# Patient Record
Sex: Male | Born: 1965 | Race: White | Hispanic: No | Smoking: Never smoker
Health system: Southern US, Community
[De-identification: ages and names within clinical notes are randomized; demographics above are authoritative.]

## PROBLEM LIST (undated history)

## (undated) DIAGNOSIS — R0600 Dyspnea, unspecified: Secondary | ICD-10-CM

## (undated) HISTORY — PX: PATELLAR TENDON REPAIR: SHX737

## (undated) HISTORY — DX: Dyspnea, unspecified: R06.00

## (undated) HISTORY — PX: LASIK: SHX215

---

## 2001-11-09 ENCOUNTER — Emergency Department (HOSPITAL_COMMUNITY): Admission: AC | Admit: 2001-11-09 | Discharge: 2001-11-09 | Payer: Self-pay

## 2001-11-09 ENCOUNTER — Encounter: Payer: Self-pay | Admitting: Emergency Medicine

## 2005-06-24 ENCOUNTER — Emergency Department: Payer: Self-pay | Admitting: Unknown Physician Specialty

## 2013-01-30 HISTORY — PX: CARDIAC CATHETERIZATION: SHX172

## 2013-08-05 ENCOUNTER — Ambulatory Visit (INDEPENDENT_AMBULATORY_CARE_PROVIDER_SITE_OTHER): Payer: BC Managed Care – PPO | Admitting: Pulmonary Disease

## 2013-08-05 ENCOUNTER — Encounter: Payer: Self-pay | Admitting: Pulmonary Disease

## 2013-08-05 ENCOUNTER — Ambulatory Visit (INDEPENDENT_AMBULATORY_CARE_PROVIDER_SITE_OTHER)
Admission: RE | Admit: 2013-08-05 | Discharge: 2013-08-05 | Disposition: A | Discharge status: Home / Self Care | Payer: BC Managed Care – PPO | Source: Ambulatory Visit | Attending: Pulmonary Disease | Admitting: Pulmonary Disease

## 2013-08-05 ENCOUNTER — Other Ambulatory Visit (INDEPENDENT_AMBULATORY_CARE_PROVIDER_SITE_OTHER): Payer: BC Managed Care – PPO

## 2013-08-05 ENCOUNTER — Encounter (INDEPENDENT_AMBULATORY_CARE_PROVIDER_SITE_OTHER): Payer: Self-pay

## 2013-08-05 VITALS — BP 150/94 | HR 76 | Ht 73.0 in | Wt 216.0 lb

## 2013-08-05 DIAGNOSIS — R0609 Other forms of dyspnea: Secondary | ICD-10-CM

## 2013-08-05 DIAGNOSIS — R0602 Shortness of breath: Secondary | ICD-10-CM

## 2013-08-05 DIAGNOSIS — R0989 Other specified symptoms and signs involving the circulatory and respiratory systems: Secondary | ICD-10-CM

## 2013-08-05 DIAGNOSIS — R9431 Abnormal electrocardiogram [ECG] [EKG]: Secondary | ICD-10-CM

## 2013-08-05 DIAGNOSIS — R0600 Dyspnea, unspecified: Secondary | ICD-10-CM

## 2013-08-05 LAB — BASIC METABOLIC PANEL
BUN: 19 mg/dL (ref 6–23)
CO2: 28 mEq/L (ref 19–32)
Calcium: 9.7 mg/dL (ref 8.4–10.5)
Chloride: 100 mEq/L (ref 96–112)
Creatinine, Ser: 1 mg/dL (ref 0.4–1.5)
GFR: 82.34 mL/min (ref >60.00)
Glucose, Bld: 101 mg/dL — ABNORMAL HIGH (ref 70–99)
Potassium: 4.1 mEq/L (ref 3.5–5.1)
Sodium: 135 mEq/L (ref 135–145)

## 2013-08-05 LAB — CBC WITH DIFFERENTIAL/PLATELET
Basophils Absolute: 0 10*3/uL (ref 0.0–0.1)
Basophils Relative: 0.6 % (ref 0.0–3.0)
Eosinophils Absolute: 0.1 10*3/uL (ref 0.0–0.7)
Eosinophils Relative: 0.9 % (ref 0.0–5.0)
HCT: 44.5 % (ref 39.0–52.0)
Hemoglobin: 14.8 g/dL (ref 13.0–17.0)
Lymphocytes Relative: 41.9 % (ref 12.0–46.0)
Lymphs Abs: 3.2 10*3/uL (ref 0.7–4.0)
MCHC: 33.3 g/dL (ref 30.0–36.0)
MCV: 88.3 fl (ref 78.0–100.0)
Monocytes Absolute: 0.7 10*3/uL (ref 0.1–1.0)
Monocytes Relative: 9.4 % (ref 3.0–12.0)
Neutro Abs: 3.6 10*3/uL (ref 1.4–7.7)
Neutrophils Relative %: 47.2 % (ref 43.0–77.0)
Platelets: 234 10*3/uL (ref 150.0–400.0)
RBC: 5.04 Mil/uL (ref 4.22–5.81)
RDW: 13.6 % (ref 11.5–15.5)
WBC: 7.5 10*3/uL (ref 4.0–10.5)

## 2013-08-05 NOTE — Progress Notes (Signed)
Subjective:    Patient ID: David Rush, male    DOB: 06/25/1965, 48 y.o.   MRN: [id number]  HPI  Past Medical History  Diagnosis Date  . Dyspnea     Mr. [name] has come to see me today because he has been noticing shortness of breath in the evenings when he is sitting at rest.  He says that the problem has been going on for over 6-8 months now and has been relatively stable.  Specifically, he feels that he often is not taking a deep enough breath so he frequently yawns or feels the need to sigh in order to fill his lungs with air.  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  He often has to carry trash, cut grass, or perform various maintenance type jobs and he never has dyspnea with that.  Further, he has not slowed down in the last year and he continues to play basketball without having to stop for shortness of breath. This problem only occurs at rest and does not occur exclusively when he his recumbent.  In fact he tells me that he feels dyspnic somewhat right now.  He has not been in an accident in the last year and he denies chest pain or leg swelling. No muscle weakness.  A ten point review of symptoms was otherwise negative.    No family history on file.   History   Social History  . Marital Status: Married    Spouse Name: N/A    Number of Children: N/A  . Years of Education: N/A   Occupational History  . real Engineer, drillingestate investor    Social History Main Topics  . Smoking status: Never Smoker   . Smokeless tobacco: Never Used  . Alcohol Use: Yes     Comment: 3 beers/week  . Drug Use: No  . Sexual Activity: Not on file   Other Topics Concern  . Not on file   Social History Narrative  . No narrative on file     Not on File   No outpatient prescriptions prior to visit.   No facility-administered medications prior to visit.      Review of Systems  Constitutional: Negative  for fever and unexpected weight change.  HENT: Negative for congestion, dental problem, ear pain, nosebleeds, postnasal drip, rhinorrhea, sinus pressure, sneezing, sore throat and trouble swallowing.   Eyes: Negative for redness and itching.  Respiratory: Positive for cough and shortness of breath. Negative for chest tightness and wheezing.   Cardiovascular: Negative for palpitations and leg swelling.  Gastrointestinal: Negative for nausea and vomiting.  Genitourinary: Negative for dysuria.  Musculoskeletal: Negative for joint swelling.  Skin: Negative for rash.  Neurological: Negative for headaches.  Hematological: Does not bruise/bleed easily.  Psychiatric/Behavioral: Negative for dysphoric mood. The patient is not nervous/anxious.        Objective:   Physical Exam Filed Vitals:   08/05/13 1548  BP: 150/94  Pulse: 76  Height: 6\' 1"  (1.854 m)  Weight: 216 lb (97.977 kg)  SpO2: 97%  RA  Ambulated 500 feet on RA and O2 saturation remained at 100%  Gen: well appearing, no acute distress HEENT: NCAT, PERRL, EOMi, OP clear, neck supple without masses PULM: CTA B CV: RRR, no mgr, no JVD AB: BS+, soft, nontender, no hsm Ext: warm, no edema, no clubbing, no cyanosis Derm: no rash or skin breakdown Neuro: A&Ox4, CN II-XII intact,  strength 5/5 in all 4 extremities   EKG: NSR, PR depression, diffuse J point elevation     Assessment & Plan:   Dyspnea Tayo's non-exertional dyspnea is hard to explain from a physiology standpoint because his exam is normal, his oximetry is normal and he is not obese.  He needs a chest x-ray, echo, cbc, basic metabolic panel, stress test, and echo to investigate further.  If all these tests are normal then he should exercise more regularly to see if that helps as deconditioning can cause dyspnea.  He does not exercise regularly.  Nonspecific abnormal electrocardiogram (ECG) (EKG) His EKG showed PR depression and diffuse ST elevation or J point  elevation which makes me consider a myocarditis or a pericardial process.  However, it is hard to imagine how these would produce resting dyspnea alone without exertional limitations.    Plan: -stress test an echo    Updated Medication List No outpatient encounter prescriptions on file as of 08/05/2013.

## 2013-08-05 NOTE — Assessment & Plan Note (Addendum)
Kayla's non-exertional dyspnea is hard to explain from a physiology standpoint because his exam is normal, his oximetry is normal and he is not obese.  He needs a chest x-ray, echo, cbc, basic metabolic panel, stress test, and echo to investigate further.  If all these tests are normal then he should exercise more regularly to see if that helps as deconditioning can cause dyspnea.  He does not exercise regularly.

## 2013-08-05 NOTE — Patient Instructions (Addendum)
[facility] will [facility] We will also arrange an echocardiogram and a stress test because your EKG was abnormal We will call you with the results of today's chest x-ray and blood work We will see you back in 3-4 weeks or sooner if needed

## 2013-08-05 NOTE — Assessment & Plan Note (Signed)
His EKG showed PR depression and diffuse ST elevation or J point elevation which makes me consider a myocarditis or a pericardial process.  However, it is hard to imagine how these would produce resting dyspnea alone without exertional limitations.    Plan: -stress test an echo

## 2013-08-19 ENCOUNTER — Telehealth: Payer: Self-pay | Admitting: Pulmonary Disease

## 2013-08-19 NOTE — Telephone Encounter (Signed)
Will forward to BQ as an FYI--- 

## 2013-08-20 ENCOUNTER — Encounter (HOSPITAL_COMMUNITY): Payer: BC Managed Care – PPO

## 2013-08-21 ENCOUNTER — Encounter (HOSPITAL_COMMUNITY): Payer: BC Managed Care – PPO

## 2013-08-21 ENCOUNTER — Ambulatory Visit (HOSPITAL_COMMUNITY): Payer: BC Managed Care – PPO

## 2013-08-26 ENCOUNTER — Telehealth: Payer: Self-pay | Admitting: Pulmonary Disease

## 2013-08-26 ENCOUNTER — Ambulatory Visit: Payer: BC Managed Care – PPO | Admitting: Pulmonary Disease

## 2013-08-26 NOTE — Telephone Encounter (Signed)
2d echo 08/27/13@4pm @[facility]  [address]-myo cardio 09/10/13@9 :30am @[facility], pfts and ov with dr Kendrick Friesmcquaid 09/17/13@1 &2pm David Rush

## 2013-08-26 NOTE — Telephone Encounter (Signed)
Pt has orders in EPIC for echo, PFT and myocardial perfusion. Pt had to cancel previous appts. Once these are scheduled then we can schedule pt to see BQ. Please advise PCC's in scheduling echo and myocardial perfusion. thanks

## 2013-08-27 ENCOUNTER — Ambulatory Visit (HOSPITAL_COMMUNITY): Payer: BC Managed Care – PPO | Attending: Cardiology

## 2013-08-28 ENCOUNTER — Encounter (HOSPITAL_COMMUNITY): Payer: Self-pay | Admitting: Pulmonary Disease

## 2013-09-02 ENCOUNTER — Ambulatory Visit (HOSPITAL_COMMUNITY): Payer: BC Managed Care – PPO

## 2013-09-05 ENCOUNTER — Telehealth (HOSPITAL_COMMUNITY): Payer: Self-pay

## 2013-09-05 NOTE — Telephone Encounter (Signed)
Encounter complete. 

## 2013-09-09 ENCOUNTER — Telehealth (HOSPITAL_COMMUNITY): Payer: Self-pay

## 2013-09-09 NOTE — Telephone Encounter (Signed)
Encounter complete. 

## 2013-09-10 ENCOUNTER — Ambulatory Visit (HOSPITAL_COMMUNITY)
Admission: RE | Admit: 2013-09-10 | Discharge: 2013-09-10 | Disposition: A | Discharge status: Home / Self Care | Payer: BC Managed Care – PPO | Source: Ambulatory Visit | Attending: Cardiovascular Disease | Admitting: Cardiovascular Disease

## 2013-09-10 DIAGNOSIS — R0602 Shortness of breath: Secondary | ICD-10-CM

## 2013-09-10 MED ORDER — TECHNETIUM TC 99M SESTAMIBI GENERIC - CARDIOLITE
30.0000 | Freq: Once | INTRAVENOUS | Status: AC | PRN
  Administered 2013-09-10: 30 via INTRAVENOUS

## 2013-09-10 MED ORDER — TECHNETIUM TC 99M SESTAMIBI GENERIC - CARDIOLITE
10.0000 | Freq: Once | INTRAVENOUS | Status: AC | PRN
  Administered 2013-09-10: 10 via INTRAVENOUS

## 2013-09-10 NOTE — Procedures (Addendum)
[name] [address] [place] [address] [phone]  Cardiology Nuclear Med Study  Marlane HatcherGeorge Travis Rush is a 48 y.o. male     MRN : [id number]     DOB: 11/14/1965  Procedure Date: 09/10/2013  Nuclear Med Background Indication for Stress Test:  Evaluation for Ischemia and Abnormal EKG History:  No prior cardiac or respiratory history reported;No prior NUC MPI for comparison. Cardiac Risk Factors: No risk factors reported;  Symptoms:  DOE, Palpitations and SOB   Nuclear Pre-Procedure Caffeine/Decaff Intake:  9:00pm NPO After: 7:00am   IV Site: R Forearm  IV 0.9% NS with Angio Cath:  22g  Chest Size (in):  48"  IV Started by: Berdie OgrenAmanda Wease, RN  Height: 6\' 1"  (1.854 m)  Cup Size: n/a  BMI:  Body mass index is 28.5 kg/(m^2). Weight:  216 lb (97.977 kg)   Tech Comments:  n/a    Nuclear Med Study 1 or 2 day study: 1 day  Stress Test Type:  Stress  Order Authorizing Provider:  Moss Mcennis McQuaid, MD   Resting Radionuclide: Technetium 5437m Sestamibi  Resting Radionuclide Dose: 10.7 mCi   Stress Radionuclide:  Technetium 6037m Sestamibi  Stress Radionuclide Dose: 30.2 mCi           Stress Protocol Rest HR:62 Stress HR: 176  Rest BP: 106/84 Stress BP: 136/94  Exercise Time (min): 10 METS: 12.3   Predicted Max HR: 171 bpm % Max HR: 102.92 bpm Rate Pressure Product: [id number]  Dose of Adenosine (mg):  n/a Dose of Lexiscan: n/a mg  Dose of Atropine (mg): n/a Dose of Dobutamine: n/a mcg/kg/min (at max HR)  Stress Test Technologist: Esperanza Sheetserry-Marie Martin, CCT Nuclear Technologist: Koren Shiverobin Moffitt, CNMT   Rest Procedure:  Myocardial perfusion imaging was performed at rest 45 minutes following the intravenous administration of Technetium 3037m Sestamibi. Stress Procedure:  The patient performed treadmill exercise using a Bruce  Protocol for 10 minutes. The patient stopped due to ST depression and mild Fatigue and denied any chest pain.   There were no significant ST-T wave changes.  Technetium 9137m Sestamibi was injected IV at peak exercise and myocardial perfusion imaging was performed after a brief delay.  Transient Ischemic Dilatation (Normal <1.22):  0.96 QGS EDV:  98 ml QGS ESV:  36 ml LV Ejection Fraction: 64%     Rest ECG: NSR, early repolarization pattern  Stress ECG: Significant ST abnormalities consistent with ischemia. 1-2 mm ST depression in the inferior leads, 1 mm ST depression in leads V5-V6  QPS Raw Data Images:  Normal; no motion artifact; normal heart/lung ratio. Stress Images:  There is decreased uptake in the anterior wall. The apex and septum are spared  Rest Images:  There is improvement in the anterior wall abnormality Subtraction (SDS):  These findings are consistent with ischemia. LV Wall Motion:  NL LV Function; NL Wall Motion  Impression Exercise Capacity:  Good exercise capacity. BP Response:  Normal blood pressure response. Clinical Symptoms:  No significant symptoms noted. ECG Impression:  Significant ST abnormalities consistent with ischemia. Comparison with Prior Nuclear Study: No previous nuclear study performed   Overall Impression:  Intermediate risk stress nuclear study with a moderate size anterior wall reversible defect. The territory does not match typical LAD artery distribution, but may represent diagonal artery obstruction.[name].   [name], MD  09/10/2013 1:05 PM

## 2013-09-11 ENCOUNTER — Telehealth: Payer: Self-pay | Admitting: Pulmonary Disease

## 2013-09-11 DIAGNOSIS — R0600 Dyspnea, unspecified: Secondary | ICD-10-CM

## 2013-09-11 NOTE — Telephone Encounter (Signed)
I called Mr. David Rush tonight to discuss the results of his stress test which showed an area of reversible ischemia.  Advised to minimize exercise for now, take a baby aspirin daily, go to ED if chest pain.  Will consult cardiology.  Triage, please arrange first available visit with cardiology  Thanks

## 2013-09-12 NOTE — Telephone Encounter (Signed)
Order already placed. Please advise PCC's thanks

## 2013-09-16 ENCOUNTER — Telehealth: Payer: Self-pay | Admitting: Pulmonary Disease

## 2013-09-16 NOTE — Telephone Encounter (Signed)
Have him hold off until after his cardiology visit Confirm he has an appointment with cardiology

## 2013-09-16 NOTE — Telephone Encounter (Signed)
BQ please advise if pt should keep the appt for tomorrow for PFT and appt with you.  Pt had the stress test done and this showed a blockage.  BQ please advise. Thanks  No Known Allergies  No current outpatient prescriptions on file prior to visit.   No current facility-administered medications on file prior to visit.

## 2013-09-16 NOTE — Telephone Encounter (Signed)
Spouse has cancelled both appts. Nothing further needed

## 2013-09-17 ENCOUNTER — Ambulatory Visit: Payer: BC Managed Care – PPO | Admitting: Pulmonary Disease

## 2013-09-22 ENCOUNTER — Encounter: Payer: Self-pay | Admitting: Cardiovascular Disease

## 2013-09-22 ENCOUNTER — Ambulatory Visit (INDEPENDENT_AMBULATORY_CARE_PROVIDER_SITE_OTHER): Payer: BC Managed Care – PPO | Admitting: Cardiovascular Disease

## 2013-09-22 VITALS — BP 110/70 | HR 58 | Ht 73.0 in | Wt 212.0 lb

## 2013-09-22 DIAGNOSIS — R0989 Other specified symptoms and signs involving the circulatory and respiratory systems: Secondary | ICD-10-CM

## 2013-09-22 DIAGNOSIS — R9439 Abnormal result of other cardiovascular function study: Secondary | ICD-10-CM | POA: Insufficient documentation

## 2013-09-22 DIAGNOSIS — R0600 Dyspnea, unspecified: Secondary | ICD-10-CM

## 2013-09-22 DIAGNOSIS — R0602 Shortness of breath: Secondary | ICD-10-CM

## 2013-09-22 DIAGNOSIS — R0609 Other forms of dyspnea: Secondary | ICD-10-CM

## 2013-09-22 NOTE — Progress Notes (Signed)
   Patient ID: David Rush, male    DOB: [birth year], 48 y.o.   MRN: [id number]  HPI Comments: Mr. Purohit is a 48 year old gentleman with shortness of breath over the past year, recently seen by Dr. Kendrick Fries of pulmonary, recent stress test who presents to establish care in the [place] office .  He reports that symptoms of shortness of breath have been coming on over the past year. Typically symptoms occur at rest in the evenings. He does not have any significant symptoms with exertion. Wife has often noted him breathing heavily for no reason .  he feels that he often is not taking a deep enough breath so he frequently yawns or feels the need to sigh in order to fill his lungs with air.  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  he plays basketball without having to stop for shortness of breath. This problem only occurs at rest and does not occur exclusively when he his recumbent.  Recent stress test showing anterior wall ischemia, normal ejection fraction  EKG shows normal sinus rhythm with rate 58 beats per minute, no significant ST or T wave changes   No outpatient encounter prescriptions on file as of 09/22/2013.    Review of Systems  Constitutional: Negative.   HENT: Negative.   Eyes: Negative.   Respiratory: Positive for shortness of breath.   Cardiovascular: Negative.   Gastrointestinal: Negative.   Endocrine: Negative.   Musculoskeletal: Negative.   Skin: Negative.   Allergic/Immunologic: Negative.   Neurological: Negative.   Hematological: Negative.   Psychiatric/Behavioral: Negative.   All other systems reviewed and are negative.   BP 110/70  Pulse 58  Ht  (1.854 m)  Wt 212 lb (96.163 kg)  BMI 27.98 kg/m2  Physical Exam  Nursing note and vitals reviewed. Constitutional: He is oriented to person, place, and time. He appears well-developed and well-nourished.  HENT:  Head: Normocephalic.  Nose: Nose  normal.  Mouth/Throat: Oropharynx is clear and moist.  Eyes: Conjunctivae are normal. Pupils are equal, round, and reactive to light.  Neck: Normal range of motion. Neck supple. No JVD present.  Cardiovascular: Normal rate, regular rhythm, S1 normal, S2 normal, normal heart sounds and intact distal pulses.  Exam reveals no gallop and no friction rub.   No murmur heard. Pulmonary/Chest: Effort normal and breath sounds normal. No respiratory distress. He has no wheezes. He has no rales. He exhibits no tenderness.  Abdominal: Soft. Bowel sounds are normal. He exhibits no distension. There is no tenderness.  Musculoskeletal: Normal range of motion. He exhibits no edema and no tenderness.  Lymphadenopathy:    He has no cervical adenopathy.  Neurological: He is alert and oriented to person, place, and time. Coordination normal.  Skin: Skin is warm and dry. No rash noted. No erythema.  Psychiatric: He has a normal mood and affect. His behavior is normal. Judgment and thought content normal.      Assessment and Plan

## 2013-09-22 NOTE — Patient Instructions (Addendum)
We will schedule you for a cardiac cath this Thursday at [facility] will be drawn today No medication changes were made.  Please call us if you have new issues that need to be addressed before your next appt.    [facility] Cardiac Cath Instructions   You are scheduled for a Cardiac Cath on:___Thursday, August 27______  Please arrive at 7:30am on the day of your procedure  You will need to pre-register prior to the day of your procedure.  Enter through the [facility] at [facility].  Registration is the first desk on your right.  Please take the procedure order we have given you in order to be registered appropriately  Do not eat/drink anything after midnight  Someone will need to drive you home  It is recommended someone be with you for the first 24 hours after your procedure  Wear clothes that are easy to get on/off and wear slip on shoes if possible  Day of your procedure: [facility] at [facility] entrance.  Free valet service is available.  After entering the [facility] please check-in at the registration desk (1st desk on your right) to receive your armband. After receiving your armband someone will escort you to the cardiac cath/special procedures waiting area.  The usual length of stay after your procedure is about 2 to 3 hours.  This can vary.  If you have any questions, please call our office at [phone], or you may call the cardiac cath lab at [facility] directly at [phone]

## 2013-09-22 NOTE — Assessment & Plan Note (Addendum)
Etiology of his shortness of breath is unclear, but will proceed with cardiac catheterization given his positive stress test. If symptoms persist or get worse, echocardiogram could be performed though this would likely be normal and there are no clinical signs of heart failure.

## 2013-09-22 NOTE — Assessment & Plan Note (Signed)
Recent treadmill Myoview showing good exercise tolerance, with perfusion defect noted in the anterior wall. We discussed the various treatment options given the positive stress test but atypical presentation of his symptoms. He has very few risk factors for underlying ischemia. My suspicion is that this may be a false positive but he is concerned about his symptoms and the test result. Risk and benefits discussed with him including risk of complication from the procedure if we did proceed with cardiac catheterization. He would like to schedule the catheterization ASAP. His wife had significant questions concerning whether to do the procedure at [facility] or [facility]. All of their questions were answered and they will proceed with the procedure at [facility] on 09/25/2013.  Percentage of stenosis in each vessel if known:  Ejection fraction from stress test or echo within the past 6 months: Normal ejection fraction by stress test, greater than 55%  Result of any study/stress testing in the prior 6 months: Recent anterior wall ischemia on stress test done at [place]  Anginal status Including stable, unstable, non-STEMI, or anginal equivalent symptoms: Stable angina  Anginal CCS of 1 through 4: 2  ACS, yes or no: No ACS  NYHA classification if CHF history or in CHF: No CHF history  Oral airway a 2

## 2013-09-23 LAB — BASIC METABOLIC PANEL
BUN/Creatinine Ratio: 15 (ref 9–20)
BUN: 14 mg/dL (ref 6–24)
CO2: 20 mmol/L (ref 18–29)
CREATININE: 0.94 mg/dL (ref 0.76–1.27)
Calcium: 9.4 mg/dL (ref 8.7–10.2)
Chloride: 102 mmol/L (ref 97–108)
GFR calc non Af Amer: 95 mL/min/{1.73_m2} (ref >59)
GFR, EST AFRICAN AMERICAN: 110 mL/min/{1.73_m2} (ref >59)
GLUCOSE: 104 mg/dL — AB (ref 65–99)
Potassium: 3.9 mmol/L (ref 3.5–5.2)
Sodium: 139 mmol/L (ref 134–144)

## 2013-09-23 LAB — PROTIME-INR
INR: 1 (ref 0.8–1.2)
PROTHROMBIN TIME: 10.5 s (ref 9.1–12.0)

## 2013-09-23 LAB — CBC WITH DIFFERENTIAL
BASOS: 1 %
Basophils Absolute: 0 10*3/uL (ref 0.0–0.2)
EOS: 1 %
Eosinophils Absolute: 0.1 10*3/uL (ref 0.0–0.4)
HCT: 45.6 % (ref 37.5–51.0)
HEMOGLOBIN: 15.4 g/dL (ref 12.6–17.7)
IMMATURE GRANULOCYTES: 0 %
Immature Grans (Abs): 0 10*3/uL (ref 0.0–0.1)
Lymphocytes Absolute: 2.3 10*3/uL (ref 0.7–3.1)
Lymphs: 37 %
MCH: 29.3 pg (ref 26.6–33.0)
MCHC: 33.8 g/dL (ref 31.5–35.7)
MCV: 87 fL (ref 79–97)
MONOCYTES: 8 %
MONOS ABS: 0.5 10*3/uL (ref 0.1–0.9)
NEUTROS PCT: 53 %
Neutrophils Absolute: 3.3 10*3/uL (ref 1.4–7.0)
Platelets: 230 10*3/uL (ref 150–379)
RBC: 5.25 x10E6/uL (ref 4.14–5.80)
RDW: 14.1 % (ref 12.3–15.4)
WBC: 6.2 10*3/uL (ref 3.4–10.8)

## 2013-09-25 ENCOUNTER — Ambulatory Visit: Payer: Self-pay | Admitting: Cardiovascular Disease

## 2013-09-25 DIAGNOSIS — R0602 Shortness of breath: Secondary | ICD-10-CM

## 2013-10-08 ENCOUNTER — Encounter: Payer: Self-pay | Admitting: Cardiovascular Disease

## 2013-10-13 ENCOUNTER — Ambulatory Visit (INDEPENDENT_AMBULATORY_CARE_PROVIDER_SITE_OTHER): Payer: BC Managed Care – PPO | Admitting: Cardiovascular Disease

## 2013-10-13 ENCOUNTER — Encounter: Payer: Self-pay | Admitting: Cardiovascular Disease

## 2013-10-13 VITALS — BP 110/90 | HR 72 | Ht 73.0 in | Wt 210.5 lb

## 2013-10-13 DIAGNOSIS — R0600 Dyspnea, unspecified: Secondary | ICD-10-CM

## 2013-10-13 DIAGNOSIS — R9439 Abnormal result of other cardiovascular function study: Secondary | ICD-10-CM

## 2013-10-13 DIAGNOSIS — R0989 Other specified symptoms and signs involving the circulatory and respiratory systems: Secondary | ICD-10-CM

## 2013-10-13 DIAGNOSIS — R0609 Other forms of dyspnea: Secondary | ICD-10-CM

## 2013-10-13 NOTE — Assessment & Plan Note (Signed)
He reports his breathing is back to baseline. Occasional deep breaths at rest likely a normal phenomenon

## 2013-10-13 NOTE — Progress Notes (Signed)
   Patient ID: David Rush, male    DOB: July 05, 1965, 48 y.o.   MRN: [id number]  HPI Comments: Mr. Stainback is a 48 year old gentleman with shortness of breath over the past year, recently seen by Dr. Kendrick Fries of pulmonary, recent stress test with anterior wall perfusion defect presents for followup in the clinic after recent cardiac catheterization.  Cardiac catheterization showed no significant coronary artery disease, no valve disease, normal left ventricular end-diastolic pressure. Good wall function with normal ejection fraction. Overall a normal study.  He has been very active, no limitations. Has not noticed any significant shortness of breath with exertion. Overall feels he is at his baseline he plays basketball without having to stop for shortness of breath.   Recent stress test showing anterior wall ischemia, normal ejection fraction  Previous EKG showed normal sinus rhythm with rate 58 beats per minute, no significant ST or T wave changes   No outpatient encounter prescriptions on file as of 10/13/2013.    Review of Systems  Constitutional: Negative.   HENT: Negative.   Eyes: Negative.   Respiratory: Negative.   Cardiovascular: Negative.   Gastrointestinal: Negative.   Endocrine: Negative.   Musculoskeletal: Negative.   Skin: Negative.   Allergic/Immunologic: Negative.   Neurological: Negative.   Hematological: Negative.   Psychiatric/Behavioral: Negative.   All other systems reviewed and are negative.   BP 110/90  Pulse 72  Ht  (1.854 m)  Wt 210 lb 8 oz (95.482 kg)  BMI 27.78 kg/m2  Physical Exam  Nursing note and vitals reviewed. Constitutional: He is oriented to person, place, and time. He appears well-developed and well-nourished.  HENT:  Head: Normocephalic.  Nose: Nose normal.  Mouth/Throat: Oropharynx is clear and moist.  Eyes: Conjunctivae are normal. Pupils are equal, round, and reactive to light.  Neck: Normal range of motion. Neck supple. No  JVD present.  Cardiovascular: Normal rate, regular rhythm, S1 normal, S2 normal, normal heart sounds and intact distal pulses.  Exam reveals no gallop and no friction rub.   No murmur heard. Pulmonary/Chest: Effort normal and breath sounds normal. No respiratory distress. He has no wheezes. He has no rales. He exhibits no tenderness.  Abdominal: Soft. Bowel sounds are normal. He exhibits no distension. There is no tenderness.  Musculoskeletal: Normal range of motion. He exhibits no edema and no tenderness.  Lymphadenopathy:    He has no cervical adenopathy.  Neurological: He is alert and oriented to person, place, and time. Coordination normal.  Skin: Skin is warm and dry. No rash noted. No erythema.  Psychiatric: He has a normal mood and affect. His behavior is normal. Judgment and thought content normal.      Assessment and Plan

## 2013-10-13 NOTE — Patient Instructions (Signed)
You are doing well. No medication changes were made.  Please call us if you have new issues that need to be addressed before your next appt.  Your physician wants you to follow-up in: 12 months.  You will receive a reminder letter in the mail two months in advance. If you don't receive a letter, please call our office to schedule the follow-up appointment. 

## 2013-10-13 NOTE — Assessment & Plan Note (Signed)
False-positive stress test. Recent catheterization showing no significant disease.

## 2013-11-14 ENCOUNTER — Other Ambulatory Visit: Payer: Self-pay

## 2013-12-18 ENCOUNTER — Ambulatory Visit (INDEPENDENT_AMBULATORY_CARE_PROVIDER_SITE_OTHER): Payer: BC Managed Care – PPO | Admitting: Internal Medicine

## 2013-12-18 ENCOUNTER — Encounter: Payer: Self-pay | Admitting: Internal Medicine

## 2013-12-18 VITALS — BP 118/82 | HR 60 | Temp 97.9°F | Ht 73.25 in | Wt 213.0 lb

## 2013-12-18 DIAGNOSIS — M79601 Pain in right arm: Secondary | ICD-10-CM

## 2013-12-18 DIAGNOSIS — M79602 Pain in left arm: Secondary | ICD-10-CM

## 2013-12-18 NOTE — Progress Notes (Signed)
Pre visit review using our clinic review tool, if applicable. No additional management support is needed unless otherwise documented below in the visit note. 

## 2013-12-18 NOTE — Patient Instructions (Signed)
Elbow Exercises EXERCISES RANGE OF MOTION (ROM) AND STRETCHING EXERCISES  These exercises may help you when beginning to rehabilitate your injury. Your symptoms may go away with or without further involvement from your physician, physical therapist or athletic trainer. While completing these exercises, remember:   Restoring tissue flexibility helps normal motion to return to the joints. This allows healthier, less painful movement and activity.  An effective stretch should be held for at least 30 seconds.  A stretch should never be painful. You should only feel a gentle lengthening or release in the stretched tissue. RANGE OF MOTION - Extension  Hold your right / left arm at your side and straighten your elbow as far as you can, using your right / left arm muscles.  Straighten the elbow farther by gently pushing down on your forearm until you feel a gentle stretch on the inside of your elbow. Hold this position for __________ seconds.  Slowly return to the starting position. Repeat __________ times. Complete this exercise __________ times per day.  RANGE OF MOTION - Flexion  Hold your right / left arm at your side and bend your elbow as far as you can using your arm muscles.  Bend the right / left elbow farther by gently pushing up on your forearm until you feel a gentle stretch on the outside of your elbow. Hold this position for __________ seconds.  Slowly return to the starting position. Repeat __________ times. Complete this exercise __________ times per day.  RANGE OF MOTION - Supination, Active  Stand or sit with your elbows at your side. Bend your right / left elbow to 90 degrees.  Turn your palm upward until you feel a gentle stretch on the inside of your forearm.  Hold this position for __________ seconds. Slowly release and return to the starting position. Repeat __________ times. Complete this stretch __________ times per day.  RANGE OF MOTION - Pronation, Active  Stand  or sit with your elbows at your side. Bend your right / left elbow to 90 degrees.  Turn your palm downward until you feel a gentle stretch on the top of your forearm.  Hold this position for __________ seconds. Slowly release and return to the starting position. Repeat __________ times. Complete this stretch __________ times per day.  STRETCH - Elbow Flexors  Lie on a firm bed or countertop, on your back. Be sure that you are in a comfortable position which will allow you to relax your arm muscles.  Place a folded towel under your right / left upper arm, so that your elbow and shoulder are at the same height. Extend your arm; your elbow should not rest on the bed or towel  Allow the weight of your hand to straighten your elbow. Keep your arm and chest muscles relaxed. Your caregiver may ask you to increase the intensity of your stretch by adding a small wrist or hand weight.  Hold for __________ seconds. You should feel a stretch on the inside of your elbow. Slowly return to the starting position. Repeat __________ times. Complete this exercise __________ times per day. STRENGTHENING EXERCISES  These exercises may help you when beginning to rehabilitate your injury. They may resolve your symptoms with or without further involvement from your physician, physical therapist or athletic trainer. While completing these exercises, remember:   Muscles can gain both the endurance and the strength needed for everyday activities through controlled exercises.  Complete these exercises as instructed by your physician, physical therapist or athletic  trainer. Increase the resistance and repetitions only as guided.  You may experience muscle soreness or fatigue, but the pain or discomfort you are trying to eliminate should never worsen during these exercises. If this pain does get worse, stop and make sure you are following the directions exactly. If the pain is still present after adjustments, discontinue  the exercise until you can discuss the trouble with your caregiver. STRENGTH - Elbow Flexors, Isometric   Stand or sit upright on a firm surface. Place your right / left arm so that your hand is palm-up and at the height of your waist.  Place your opposite hand on top of your forearm. Gently push down as your right / left arm resists. Push as hard as you can with both arms without causing any pain or movement at your right / left elbow. Hold this stationary position for __________ seconds.  Gradually release the tension in both arms. Allow your muscles to relax completely before repeating. Repeat __________ times. Complete this exercise __________ times per day. STRENGTH - Elbow Extensors, Isometric  Stand or sit upright on a firm surface. Place your right / left arm so that your palm faces your abdomen and it is at the height of your waist.  Place your opposite hand on the underside of your forearm. Gently push up as your right / left arm resists. Push as hard as you can with both arms without causing any pain or movement at your right / left elbow. Hold this stationary position for __________ seconds.  Gradually release the tension in both arms. Allow your muscles to relax completely before repeating. Repeat __________ times. Complete this exercise __________ times per day. STRENGTH - Elbow Flexors, Supinated  With good posture, stand, or sit on a firm chair without armrests. Allow your right / left arm to rest at your side with your palm facing forward.  Holding a __________ weight, or gripping a rubber exercise band or tubing, bring your hand toward your shoulder.  Allow your muscles to control the resistance, as your hand returns to your side. Repeat __________ times. Complete this exercise __________ times per day.  STRENGTH - Elbow Extensors, Dynamic  With good posture, stand, or sit on a firm chair without armrests. Keeping your upper arms at your side, bring both hands up toward  your right / left shoulder while gripping a rubber exercise band or tubing. Your right / left hand should be just below the other hand.  Straighten your right / left elbow. Hold for __________ seconds.  Allow your muscles to control the rubber exercise band, as your hand returns to your shoulder. Repeat __________ times. Complete this exercise __________ times per day. STRENGTH - Forearm Supinators   Sit with your right / left forearm supported on a table, keeping your elbow below shoulder height. Rest your hand over the edge, palm down.  Gently grip a hammer or a soup ladle.  Without moving your elbow, slowly turn your palm and hand upward to a "thumbs-up" position.  Hold this position for __________ seconds. Slowly return to the starting position. Repeat __________ times. Complete this exercise __________ times per day.  STRENGTH - Forearm Pronators  Sit with your right / left forearm supported on a table, keeping your elbow below shoulder height. Rest your hand over the edge, palm up.  Gently grip a hammer or a soup ladle.  Without moving your elbow, slowly turn your palm and hand upward to a "thumbs-up" position.  Hold this  position for __________ seconds. Slowly return to the starting position. Repeat __________ times. Complete this exercise __________ times per day. Document Released: 11/30/2004 Document Revised: 04/10/2011 Document Reviewed: 04/30/2008 [facility] Patient Information 2015 [place], [place]. This information is not intended to replace advice given to you by your health care provider. Make sure you discuss any questions you have with your health care provider. Elbow Exercises EXERCISES RANGE OF MOTION (ROM) AND STRETCHING EXERCISES  These exercises may help you when beginning to rehabilitate your injury. Your symptoms may go away with or without further involvement from your physician, physical therapist or athletic trainer. While completing these exercises, remember:     Restoring tissue flexibility helps normal motion to return to the joints. This allows healthier, less painful movement and activity.  An effective stretch should be held for at least 30 seconds.  A stretch should never be painful. You should only feel a gentle lengthening or release in the stretched tissue. RANGE OF MOTION - Extension  Hold your right / left arm at your side and straighten your elbow as far as you can, using your right / left arm muscles.  Straighten the elbow farther by gently pushing down on your forearm until you feel a gentle stretch on the inside of your elbow. Hold this position for __________ seconds.  Slowly return to the starting position. Repeat __________ times. Complete this exercise __________ times per day.  RANGE OF MOTION - Flexion  Hold your right / left arm at your side and bend your elbow as far as you can using your arm muscles.  Bend the right / left elbow farther by gently pushing up on your forearm until you feel a gentle stretch on the outside of your elbow. Hold this position for __________ seconds.  Slowly return to the starting position. Repeat __________ times. Complete this exercise __________ times per day.  RANGE OF MOTION - Supination, Active  Stand or sit with your elbows at your side. Bend your right / left elbow to 90 degrees.  Turn your palm upward until you feel a gentle stretch on the inside of your forearm.  Hold this position for __________ seconds. Slowly release and return to the starting position. Repeat __________ times. Complete this stretch __________ times per day.  RANGE OF MOTION - Pronation, Active  Stand or sit with your elbows at your side. Bend your right / left elbow to 90 degrees.  Turn your palm downward until you feel a gentle stretch on the top of your forearm.  Hold this position for __________ seconds. Slowly release and return to the starting position. Repeat __________ times. Complete this stretch  __________ times per day.  STRETCH - Elbow Flexors  Lie on a firm bed or countertop, on your back. Be sure that you are in a comfortable position which will allow you to relax your arm muscles.  Place a folded towel under your right / left upper arm, so that your elbow and shoulder are at the same height. Extend your arm; your elbow should not rest on the bed or towel  Allow the weight of your hand to straighten your elbow. Keep your arm and chest muscles relaxed. Your caregiver may ask you to increase the intensity of your stretch by adding a small wrist or hand weight.  Hold for __________ seconds. You should feel a stretch on the inside of your elbow. Slowly return to the starting position. Repeat __________ times. Complete this exercise __________ times per day. STRENGTHENING EXERCISES  These exercises may help you when beginning to rehabilitate your injury. They may resolve your symptoms with or without further involvement from your physician, physical therapist or athletic trainer. While completing these exercises, remember:   Muscles can gain both the endurance and the strength needed for everyday activities through controlled exercises.  Complete these exercises as instructed by your physician, physical therapist or athletic trainer. Increase the resistance and repetitions only as guided.  You may experience muscle soreness or fatigue, but the pain or discomfort you are trying to eliminate should never worsen during these exercises. If this pain does get worse, stop and make sure you are following the directions exactly. If the pain is still present after adjustments, discontinue the exercise until you can discuss the trouble with your caregiver. STRENGTH - Elbow Flexors, Isometric   Stand or sit upright on a firm surface. Place your right / left arm so that your hand is palm-up and at the height of your waist.  Place your opposite hand on top of your forearm. Gently push down as your  right / left arm resists. Push as hard as you can with both arms without causing any pain or movement at your right / left elbow. Hold this stationary position for __________ seconds.  Gradually release the tension in both arms. Allow your muscles to relax completely before repeating. Repeat __________ times. Complete this exercise __________ times per day. STRENGTH - Elbow Extensors, Isometric  Stand or sit upright on a firm surface. Place your right / left arm so that your palm faces your abdomen and it is at the height of your waist.  Place your opposite hand on the underside of your forearm. Gently push up as your right / left arm resists. Push as hard as you can with both arms without causing any pain or movement at your right / left elbow. Hold this stationary position for __________ seconds.  Gradually release the tension in both arms. Allow your muscles to relax completely before repeating. Repeat __________ times. Complete this exercise __________ times per day. STRENGTH - Elbow Flexors, Supinated  With good posture, stand, or sit on a firm chair without armrests. Allow your right / left arm to rest at your side with your palm facing forward.  Holding a __________ weight, or gripping a rubber exercise band or tubing, bring your hand toward your shoulder.  Allow your muscles to control the resistance, as your hand returns to your side. Repeat __________ times. Complete this exercise __________ times per day.  STRENGTH - Elbow Extensors, Dynamic  With good posture, stand, or sit on a firm chair without armrests. Keeping your upper arms at your side, bring both hands up toward your right / left shoulder while gripping a rubber exercise band or tubing. Your right / left hand should be just below the other hand.  Straighten your right / left elbow. Hold for __________ seconds.  Allow your muscles to control the rubber exercise band, as your hand returns to your shoulder. Repeat  __________ times. Complete this exercise __________ times per day. STRENGTH - Forearm Supinators   Sit with your right / left forearm supported on a table, keeping your elbow below shoulder height. Rest your hand over the edge, palm down.  Gently grip a hammer or a soup ladle.  Without moving your elbow, slowly turn your palm and hand upward to a "thumbs-up" position.  Hold this position for __________ seconds. Slowly return to the starting position. Repeat __________ times. Complete  this exercise __________ times per day.  STRENGTH - Forearm Pronators  Sit with your right / left forearm supported on a table, keeping your elbow below shoulder height. Rest your hand over the edge, palm up.  Gently grip a hammer or a soup ladle.  Without moving your elbow, slowly turn your palm and hand upward to a "thumbs-up" position.  Hold this position for __________ seconds. Slowly return to the starting position. Repeat __________ times. Complete this exercise __________ times per day. Document Released: 11/30/2004 Document Revised: 04/10/2011 Document Reviewed: 04/30/2008 [facility] Patient Information 2015 [place], [place]. This information is not intended to replace advice given to you by your health care provider. Make sure you discuss any questions you have with your health care provider. Elbow Exercises EXERCISES RANGE OF MOTION (ROM) AND STRETCHING EXERCISES  These exercises may help you when beginning to rehabilitate your injury. Your symptoms may go away with or without further involvement from your physician, physical therapist or athletic trainer. While completing these exercises, remember:   Restoring tissue flexibility helps normal motion to return to the joints. This allows healthier, less painful movement and activity.  An effective stretch should be held for at least 30 seconds.  A stretch should never be painful. You should only feel a gentle lengthening or release in the stretched  tissue. RANGE OF MOTION - Extension  Hold your right / left arm at your side and straighten your elbow as far as you can, using your right / left arm muscles.  Straighten the elbow farther by gently pushing down on your forearm until you feel a gentle stretch on the inside of your elbow. Hold this position for __________ seconds.  Slowly return to the starting position. Repeat __________ times. Complete this exercise __________ times per day.  RANGE OF MOTION - Flexion  Hold your right / left arm at your side and bend your elbow as far as you can using your arm muscles.  Bend the right / left elbow farther by gently pushing up on your forearm until you feel a gentle stretch on the outside of your elbow. Hold this position for __________ seconds.  Slowly return to the starting position. Repeat __________ times. Complete this exercise __________ times per day.  RANGE OF MOTION - Supination, Active  Stand or sit with your elbows at your side. Bend your right / left elbow to 90 degrees.  Turn your palm upward until you feel a gentle stretch on the inside of your forearm.  Hold this position for __________ seconds. Slowly release and return to the starting position. Repeat __________ times. Complete this stretch __________ times per day.  RANGE OF MOTION - Pronation, Active  Stand or sit with your elbows at your side. Bend your right / left elbow to 90 degrees.  Turn your palm downward until you feel a gentle stretch on the top of your forearm.  Hold this position for __________ seconds. Slowly release and return to the starting position. Repeat __________ times. Complete this stretch __________ times per day.  STRETCH - Elbow Flexors  Lie on a firm bed or countertop, on your back. Be sure that you are in a comfortable position which will allow you to relax your arm muscles.  Place a folded towel under your right / left upper arm, so that your elbow and shoulder are at the same  height. Extend your arm; your elbow should not rest on the bed or towel  Allow the weight of your hand to  straighten your elbow. Keep your arm and chest muscles relaxed. Your caregiver may ask you to increase the intensity of your stretch by adding a small wrist or hand weight.  Hold for __________ seconds. You should feel a stretch on the inside of your elbow. Slowly return to the starting position. Repeat __________ times. Complete this exercise __________ times per day. STRENGTHENING EXERCISES  These exercises may help you when beginning to rehabilitate your injury. They may resolve your symptoms with or without further involvement from your physician, physical therapist or athletic trainer. While completing these exercises, remember:   Muscles can gain both the endurance and the strength needed for everyday activities through controlled exercises.  Complete these exercises as instructed by your physician, physical therapist or athletic trainer. Increase the resistance and repetitions only as guided.  You may experience muscle soreness or fatigue, but the pain or discomfort you are trying to eliminate should never worsen during these exercises. If this pain does get worse, stop and make sure you are following the directions exactly. If the pain is still present after adjustments, discontinue the exercise until you can discuss the trouble with your caregiver. STRENGTH - Elbow Flexors, Isometric   Stand or sit upright on a firm surface. Place your right / left arm so that your hand is palm-up and at the height of your waist.  Place your opposite hand on top of your forearm. Gently push down as your right / left arm resists. Push as hard as you can with both arms without causing any pain or movement at your right / left elbow. Hold this stationary position for __________ seconds.  Gradually release the tension in both arms. Allow your muscles to relax completely before repeating. Repeat  __________ times. Complete this exercise __________ times per day. STRENGTH - Elbow Extensors, Isometric  Stand or sit upright on a firm surface. Place your right / left arm so that your palm faces your abdomen and it is at the height of your waist.  Place your opposite hand on the underside of your forearm. Gently push up as your right / left arm resists. Push as hard as you can with both arms without causing any pain or movement at your right / left elbow. Hold this stationary position for __________ seconds.  Gradually release the tension in both arms. Allow your muscles to relax completely before repeating. Repeat __________ times. Complete this exercise __________ times per day. STRENGTH - Elbow Flexors, Supinated  With good posture, stand, or sit on a firm chair without armrests. Allow your right / left arm to rest at your side with your palm facing forward.  Holding a __________ weight, or gripping a rubber exercise band or tubing, bring your hand toward your shoulder.  Allow your muscles to control the resistance, as your hand returns to your side. Repeat __________ times. Complete this exercise __________ times per day.  STRENGTH - Elbow Extensors, Dynamic  With good posture, stand, or sit on a firm chair without armrests. Keeping your upper arms at your side, bring both hands up toward your right / left shoulder while gripping a rubber exercise band or tubing. Your right / left hand should be just below the other hand.  Straighten your right / left elbow. Hold for __________ seconds.  Allow your muscles to control the rubber exercise band, as your hand returns to your shoulder. Repeat __________ times. Complete this exercise __________ times per day. STRENGTH - Forearm Supinators   Sit with your  right / left forearm supported on a table, keeping your elbow below shoulder height. Rest your hand over the edge, palm down.  Gently grip a hammer or a soup ladle.  Without moving  your elbow, slowly turn your palm and hand upward to a "thumbs-up" position.  Hold this position for __________ seconds. Slowly return to the starting position. Repeat __________ times. Complete this exercise __________ times per day.  STRENGTH - Forearm Pronators  Sit with your right / left forearm supported on a table, keeping your elbow below shoulder height. Rest your hand over the edge, palm up.  Gently grip a hammer or a soup ladle.  Without moving your elbow, slowly turn your palm and hand upward to a "thumbs-up" position.  Hold this position for __________ seconds. Slowly return to the starting position. Repeat __________ times. Complete this exercise __________ times per day. Document Released: 11/30/2004 Document Revised: 04/10/2011 Document Reviewed: 04/30/2008 [facility] Patient Information 2015 [place]. This information is not intended to replace advice given to you by your health care provider. Make sure you discuss any questions you have with your health care provider.

## 2013-12-18 NOTE — Progress Notes (Signed)
HPI  Pt presents to the clinic today to establish care. He has not had a PCP in many years. He does have some concerns today about bilateral arm pain. He reports that he is unable to fully extend his arms at the elbows. He does have pain with extension. He denies any specific injury to the area. He has been evaluated by ortho, Dr. Katrinka BlazingSmith in [place]. Xrays were done and he was told he had arthritis and that he should avoid strenuous activity as this would make it worse. He denies numbness or tingling in the arms. He denies pain in the elbow joint. He has tried Mobic without much relief.  Flu: never Tetanus: > 10 years ago Vision Screening: as needed Dentist: biannually  Past Medical History  Diagnosis Date  . Dyspnea     Current Outpatient Prescriptions  Medication Sig Dispense Refill  . meloxicam (MOBIC) 15 MG tablet   0   No current facility-administered medications for this visit.    No Known Allergies  Family History  Problem Relation Age of Onset  . Diabetes Maternal Grandmother   . Stroke Maternal Grandmother   . Cancer Neg Hx   . Heart disease Neg Hx     History   Social History  . Marital Status: Divorced    Spouse Name: N/A    Number of Children: N/A  . Years of Education: N/A   Occupational History  . real Engineer, drillingestate investor    Social History Main Topics  . Smoking status: Never Smoker   . Smokeless tobacco: Never Used  . Alcohol Use: 0.0 oz/week    0 Not specified per week     Comment: 3 beers/week  . Drug Use: No  . Sexual Activity: Yes   Other Topics Concern  . Not on file   Social History Narrative    ROS:  Constitutional: Denies fever, malaise, fatigue, headache or abrupt weight changes.  Respiratory: Denies difficulty breathing, shortness of breath, cough or sputum production.   Cardiovascular: Denies chest pain, chest tightness, palpitations or swelling in the hands or feet.  Musculoskeletal: Pt reports bilateral arm pain. Denies  difficulty with gait, muscle pain or joint swelling.    No other specific complaints in a complete review of systems (except as listed in HPI above).  PE:  BP 118/82 mmHg  Pulse 60  Temp(Src) 97.9 F (36.6 C) (Oral)  Ht 6' 1.25" (1.861 m)  Wt 213 lb (96.616 kg)  BMI 27.90 kg/m2  SpO2 98% Wt Readings from Last 3 Encounters:  12/18/13 213 lb (96.616 kg)  10/13/13 210 lb 8 oz (95.482 kg)  09/22/13 212 lb (96.163 kg)    General: Appears his stated age, well developed, well nourished in NAD. Cardiovascular: Normal rate and rhythm. S1,S2 noted.  No murmur, rubs or gallops noted.  Pulmonary/Chest: Normal effort and positive vesicular breath sounds. No respiratory distress. No wheezes, rales or ronchi noted.  Musculoskeletal: Decreased extension of the elbow, R>L. Unable to fully extend the elbow with passive ROM. Normal flexion of the elbows. Shoulder/arm strength 5/5. Hand grips equal bilaterally.  BMET    Component Value Date/Time   NA 139 09/22/2013 0825   NA 135 08/05/2013 1651   K 3.9 09/22/2013 0825   CL 102 09/22/2013 0825   CO2 20 09/22/2013 0825   GLUCOSE 104* 09/22/2013 0825   GLUCOSE 101* 08/05/2013 1651   BUN 14 09/22/2013 0825   BUN 19 08/05/2013 1651   CREATININE 0.94 09/22/2013 0825  CALCIUM 9.4 09/22/2013 0825   GFRNONAA 95 09/22/2013 0825   GFRAA 110 09/22/2013 0825    Lipid Panel  No results found for: CHOL, TRIG, HDL, CHOLHDL, VLDL, LDLCALC  CBC    Component Value Date/Time   WBC 6.2 09/22/2013 0825   WBC 7.5 08/05/2013 1651   RBC 5.25 09/22/2013 0825   RBC 5.04 08/05/2013 1651   HGB 15.4 09/22/2013 0825   HCT 45.6 09/22/2013 0825   PLT 230 09/22/2013 0825   MCV 87 09/22/2013 0825   MCH 29.3 09/22/2013 0825   MCHC 33.8 09/22/2013 0825   MCHC 33.3 08/05/2013 1651   RDW 14.1 09/22/2013 0825   RDW 13.6 08/05/2013 1651   LYMPHSABS 2.3 09/22/2013 0825   LYMPHSABS 3.2 08/05/2013 1651   MONOABS 0.7 08/05/2013 1651   EOSABS 0.1 09/22/2013 0825    EOSABS 0.1 08/05/2013 1651   BASOSABS 0.0 09/22/2013 0825   BASOSABS 0.0 08/05/2013 1651    Hgb A1C No results found for: HGBA1C   Assessment and Plan:  Bilateral arm pain, and decreased extension of elbows:  He has tried stretching, it seems to help some Will refer to PT for further evaluation Continue to take Mobic as directed  RTC in 2 months for physical exam

## 2013-12-22 ENCOUNTER — Encounter: Payer: Self-pay | Admitting: Internal Medicine

## 2013-12-30 ENCOUNTER — Encounter: Payer: Self-pay | Admitting: Internal Medicine

## 2014-01-30 ENCOUNTER — Encounter: Payer: Self-pay | Admitting: Internal Medicine

## 2014-02-04 ENCOUNTER — Other Ambulatory Visit: Payer: Self-pay | Admitting: Internal Medicine

## 2014-02-04 DIAGNOSIS — Z0000 Encounter for general adult medical examination without abnormal findings: Secondary | ICD-10-CM

## 2014-02-10 ENCOUNTER — Other Ambulatory Visit: Payer: BC Managed Care – PPO

## 2014-02-11 ENCOUNTER — Other Ambulatory Visit (INDEPENDENT_AMBULATORY_CARE_PROVIDER_SITE_OTHER): Payer: BLUE CROSS/BLUE SHIELD

## 2014-02-11 DIAGNOSIS — Z0000 Encounter for general adult medical examination without abnormal findings: Secondary | ICD-10-CM

## 2014-02-11 LAB — COMPREHENSIVE METABOLIC PANEL
ALK PHOS: 63 U/L (ref 39–117)
ALT: 72 U/L — AB (ref 0–53)
AST: 41 U/L — ABNORMAL HIGH (ref 0–37)
Albumin: 4.2 g/dL (ref 3.5–5.2)
BILIRUBIN TOTAL: 0.4 mg/dL (ref 0.2–1.2)
BUN: 12 mg/dL (ref 6–23)
CO2: 30 mEq/L (ref 19–32)
Calcium: 9.7 mg/dL (ref 8.4–10.5)
Chloride: 104 mEq/L (ref 96–112)
Creatinine, Ser: 1.06 mg/dL (ref 0.40–1.50)
GFR: 78.59 mL/min (ref >60.00)
Glucose, Bld: 84 mg/dL (ref 70–99)
POTASSIUM: 4.2 meq/L (ref 3.5–5.1)
Sodium: 140 mEq/L (ref 135–145)
Total Protein: 7 g/dL (ref 6.0–8.3)

## 2014-02-11 LAB — LIPID PANEL
Cholesterol: 162 mg/dL (ref 0–200)
HDL: 35.3 mg/dL — AB (ref >39.00)
NONHDL: 126.7
TRIGLYCERIDES: 233 mg/dL — AB (ref 0.0–149.0)
Total CHOL/HDL Ratio: 5
VLDL: 46.6 mg/dL — ABNORMAL HIGH (ref 0.0–40.0)

## 2014-02-11 LAB — CBC
HCT: 44.9 % (ref 39.0–52.0)
Hemoglobin: 14.9 g/dL (ref 13.0–17.0)
MCHC: 33.2 g/dL (ref 30.0–36.0)
MCV: 88.7 fl (ref 78.0–100.0)
PLATELETS: 253 10*3/uL (ref 150.0–400.0)
RBC: 5.06 Mil/uL (ref 4.22–5.81)
RDW: 14.3 % (ref 11.5–15.5)
WBC: 6.9 10*3/uL (ref 4.0–10.5)

## 2014-02-11 LAB — LDL CHOLESTEROL, DIRECT: Direct LDL: 103 mg/dL

## 2014-02-11 LAB — PSA: PSA: 0.43 ng/mL (ref 0.10–4.00)

## 2014-02-17 ENCOUNTER — Encounter: Payer: BC Managed Care – PPO | Admitting: Internal Medicine

## 2014-02-17 ENCOUNTER — Encounter: Payer: BLUE CROSS/BLUE SHIELD | Admitting: Internal Medicine

## 2014-02-23 ENCOUNTER — Encounter: Payer: Self-pay | Admitting: Internal Medicine

## 2014-02-23 ENCOUNTER — Ambulatory Visit (INDEPENDENT_AMBULATORY_CARE_PROVIDER_SITE_OTHER): Payer: BLUE CROSS/BLUE SHIELD | Admitting: Internal Medicine

## 2014-02-23 VITALS — BP 124/76 | HR 60 | Temp 97.7°F | Ht 73.25 in | Wt 216.0 lb

## 2014-02-23 DIAGNOSIS — Z23 Encounter for immunization: Secondary | ICD-10-CM

## 2014-02-23 DIAGNOSIS — Z0000 Encounter for general adult medical examination without abnormal findings: Secondary | ICD-10-CM

## 2014-02-23 DIAGNOSIS — Z1211 Encounter for screening for malignant neoplasm of colon: Secondary | ICD-10-CM

## 2014-02-23 NOTE — Addendum Note (Signed)
Addended by: Roena MaladyEVONTENNO, Breklyn Fabrizio Y on: 02/23/2014 04:36 PM   Modules accepted: Orders

## 2014-02-23 NOTE — Progress Notes (Signed)
   Subjective:    Patient ID: David Rush, male    DOB: 01/16/1966, 49 y.o.   MRN: [id number]  HPI   David Rush is a 49 year old male who presents today for their physical exam.    Tetanus : Last was more than 10 years ago. Will give today.  Flu: No, declines today.  Colonoscopy/IFOB: Never. Would like to schedule colonoscopy. Vision: Does not see regularly. Dental: See yearly.   Review of Systems  Constitutional: Negative for chills, diaphoresis, activity change and fatigue.  HENT: Negative.   Eyes: Negative.   Respiratory: Negative for cough, chest tightness and shortness of breath.   Cardiovascular: Negative for chest pain, palpitations and leg swelling.  Gastrointestinal: Negative for abdominal pain, diarrhea, constipation and blood in stool.  Genitourinary: Negative for dysuria, frequency and difficulty urinating.  Musculoskeletal: Negative for back pain, joint swelling and gait problem.  Skin: Negative for color change, pallor and rash.  Neurological: Negative for dizziness, light-headedness and headaches.   Past Medical History  Diagnosis Date  . Dyspnea    Family History  Problem Relation Age of Onset  . Diabetes Maternal Grandmother   . Stroke Maternal Grandmother   . Cancer Neg Hx   . Heart disease Neg Hx    Current Outpatient Prescriptions on File Prior to Visit  Medication Sig Dispense Refill  . meloxicam (MOBIC) 15 MG tablet   0   No current facility-administered medications on file prior to visit.        Objective:   Physical Exam  Constitutional: He is oriented to person, place, and time. He appears well-developed and well-nourished.  HENT:  Head: Normocephalic and atraumatic.  Right Ear: External ear normal.  Left Ear: External ear normal.  Mouth/Throat: Oropharynx is clear and moist.  Eyes: EOM are normal. Pupils are equal, round, and reactive to light.  Neck: Normal range of motion. Neck supple. No JVD present. No thyromegaly present.    Cardiovascular: Normal rate, regular rhythm, normal heart sounds and intact distal pulses.   No murmur heard. Pulmonary/Chest: Effort normal and breath sounds normal.  Abdominal: Soft. Bowel sounds are normal.  Musculoskeletal: Normal range of motion.  Lymphadenopathy:    He has no cervical adenopathy.  Neurological: He is alert and oriented to person, place, and time.  Skin: Skin is warm and dry.  Psychiatric: He has a normal mood and affect.     BP 124/76 mmHg  Pulse 60  Temp(Src) 97.7 F (36.5 C) (Oral)  Ht 6' 1.25" (1.861 m)  Wt 216 lb (97.977 kg)  BMI 28.29 kg/m2  SpO2 98%      Assessment & Plan:   Preventative Health Maintenance:  Reviewed labs with patient, discussed increasing intake of fruits and vegetables.  Will refer to GI to schedule colonoscopy  Tdap today.    RTC in 1 year or sooner if needed

## 2014-02-23 NOTE — Patient Instructions (Signed)

## 2014-02-23 NOTE — Progress Notes (Signed)
Pre visit review using our clinic review tool, if applicable. No additional management support is needed unless otherwise documented below in the visit note. 

## 2014-03-02 ENCOUNTER — Encounter: Payer: Self-pay | Admitting: Internal Medicine

## 2014-04-22 ENCOUNTER — Ambulatory Visit (AMBULATORY_SURGERY_CENTER): Payer: Self-pay | Admitting: *Deleted

## 2014-04-22 VITALS — Ht 73.0 in | Wt 215.0 lb

## 2014-04-22 DIAGNOSIS — Z1211 Encounter for screening for malignant neoplasm of colon: Secondary | ICD-10-CM

## 2014-04-22 NOTE — Progress Notes (Signed)
Patient denies any allergies to eggs or soy. Patient denies any problems with anesthesia/sedation. Patient denies any oxygen use at home and does not take any diet/weight loss medications. Patient declined EMMI information. 

## 2014-05-05 ENCOUNTER — Encounter: Payer: Self-pay | Admitting: Internal Medicine

## 2014-05-06 ENCOUNTER — Ambulatory Visit (AMBULATORY_SURGERY_CENTER): Payer: BLUE CROSS/BLUE SHIELD | Admitting: Internal Medicine

## 2014-05-06 ENCOUNTER — Encounter: Payer: Self-pay | Admitting: Internal Medicine

## 2014-05-06 VITALS — BP 122/80 | HR 70 | Temp 96.1°F | Resp 21 | Ht 73.0 in | Wt 215.0 lb

## 2014-05-06 DIAGNOSIS — Z1211 Encounter for screening for malignant neoplasm of colon: Secondary | ICD-10-CM

## 2014-05-06 MED ORDER — SODIUM CHLORIDE 0.9 % IV SOLN: 500.0000 mL | INTRAVENOUS | Status: DC

## 2014-05-06 NOTE — Progress Notes (Signed)
Report to PACU, RN, vss, BBS= Clear.  

## 2014-05-06 NOTE — Op Note (Signed)
 [facility] [address] [place], [id number]   COLONOSCOPY PROCEDURE REPORT  PATIENT: David Rush, David Rush  MR#: [id number] BIRTHDATE: Jan 02, 1966 , 49  yrs. old GENDER: male ENDOSCOPIST: Iva Booparl E Gessner, MD, [facility] PROCEDURE DATE:  05/06/2014 PROCEDURE:   Colonoscopy, screening First Screening Colonoscopy - Avg.  risk and is 50 yrs.  old or older Yes.  Prior Negative Screening - Now for repeat screening. N/A  History of Adenoma - Now for follow-up colonoscopy & has been > or = to 3 yrs.  N/A ASA CLASS:   Class II INDICATIONS:Screening for colonic neoplasia and Colorectal Neoplasm Risk Assessment for this procedure is average risk. MEDICATIONS: Propofol 200 mg IV and Monitored anesthesia care  DESCRIPTION OF PROCEDURE:   After the risks benefits and alternatives of the procedure were thoroughly explained, informed consent was obtained.  The digital rectal exam revealed no abnormalities of the rectum, revealed no prostatic nodules, and revealed the prostate was not enlarged.   The LB [id number] endoscope was introduced through the anus and advanced to the cecum, which was identified by both the appendix and ileocecal valve. No adverse events experienced.   The quality of the prep was excellent.  (MiraLax was used)  The instrument was then slowly withdrawn as the colon was fully examined.      COLON FINDINGS: A normal appearing cecum, ileocecal valve, and appendiceal orifice were identified.  The ascending, transverse, descending, sigmoid colon, and rectum appeared unremarkable. Right colon retroflexion included.  Retroflexed views revealed no abnormalities. The time to cecum = 1.9 Withdrawal time = 9.9   The scope was withdrawn and the procedure completed. COMPLICATIONS: There were no immediate complications.  ENDOSCOPIC IMPRESSION: Normal colonoscopy - excellent prep - first screening  RECOMMENDATIONS: Repeat colonoscopy 10 years (2026)  eSigned:  Iva Booparl  E Gessner, MD, [facility] 05/06/2014 8:56 AM   cc: Nicki Reaperegina Baity, NP and The Patient

## 2014-05-06 NOTE — Patient Instructions (Addendum)
Your colonoscopy was normal!  Next routine colonoscopy in 10 years - 2026  I appreciate the opportunity to care for you. Iva Boop, MD, FACG     YOU HAD AN ENDOSCOPIC PROCEDURE TODAY AT THE [facility]:   Refer to the procedure report that was given to you for any specific questions about what was found during the examination.  If the procedure report does not answer your questions, please call your gastroenterologist to clarify.  If you requested that your care partner not be given the details of your procedure findings, then the procedure report has been included in a sealed envelope for you to review at your convenience later.  YOU SHOULD EXPECT: Some feelings of bloating in the abdomen. Passage of more gas than usual.  Walking can help get rid of the air that was put into your GI tract during the procedure and reduce the bloating. If you had a lower endoscopy (such as a colonoscopy or flexible sigmoidoscopy) you may notice spotting of blood in your stool or on the toilet paper. If you underwent a bowel prep for your procedure, you may not have a normal bowel movement for a few days.  Please Note:  You might notice some irritation and congestion in your nose or some drainage.  This is from the oxygen used during your procedure.  There is no need for concern and it should clear up in a day or so.  SYMPTOMS TO REPORT IMMEDIATELY:   Following lower endoscopy (colonoscopy or flexible sigmoidoscopy):  Excessive amounts of blood in the stool  Significant tenderness or worsening of abdominal pains  Swelling of the abdomen that is new, acute  Fever of 100F or higher   For urgent or emergent issues, a gastroenterologist can be reached at any hour by calling [phone].   DIET: Your first meal following the procedure should be a small meal and then it is ok to progress to your normal diet. Heavy or fried foods are harder to digest and may make you feel nauseous  or bloated.  Likewise, meals heavy in dairy and vegetables can increase bloating.  Drink plenty of fluids but you should avoid alcoholic beverages for 24 hours.  ACTIVITY:  You should plan to take it easy for the rest of today and you should NOT DRIVE or use heavy machinery until tomorrow (because of the sedation medicines used during the test).    FOLLOW UP: Our staff will call the number listed on your records the next business day following your procedure to check on you and address any questions or concerns that you may have regarding the information given to you following your procedure. If we do not reach you, we will leave a message.  However, if you are feeling well and you are not experiencing any problems, there is no need to return our call.  We will assume that you have returned to your regular daily activities without incident.  If any biopsies were taken you will be contacted by phone or by letter within the next 1-3 weeks.  Please call us at [phone] if you have not heard about the biopsies in 3 weeks.    SIGNATURES/CONFIDENTIALITY: You and/or your care partner have signed paperwork which will be entered into your electronic medical record.  These signatures attest to the fact that that the information above on your After Visit Summary has been reviewed and is understood.  Full responsibility of the confidentiality of this discharge  information lies with you and/or your care-partner.   Resume medications.

## 2014-05-07 ENCOUNTER — Telehealth: Payer: Self-pay | Admitting: *Deleted

## 2014-05-07 NOTE — Telephone Encounter (Signed)
  Follow up Call-  Call back number 05/06/2014  Post procedure Call Back phone  # [phone]  Permission to leave phone message Yes     Patient questions:  Do you have a fever, pain , or abdominal swelling? No. Pain Score  0 *  Have you tolerated food without any problems? Yes.    Have you been able to return to your normal activities? Yes.    Do you have any questions about your discharge instructions: Diet   No. Medications  No. Follow up visit  No.  Do you have questions or concerns about your Care? No.  Actions: * If pain score is 4 or above: No action needed, pain <4.

## 2014-12-09 ENCOUNTER — Telehealth: Payer: Self-pay | Admitting: Cardiovascular Disease

## 2014-12-09 NOTE — Telephone Encounter (Signed)
3 attempts made to schedule fu from recall list.   LMOV to call office .   Deleting Recall.  °

## 2015-02-18 ENCOUNTER — Other Ambulatory Visit: Payer: Self-pay | Admitting: Internal Medicine

## 2015-02-18 DIAGNOSIS — Z0000 Encounter for general adult medical examination without abnormal findings: Secondary | ICD-10-CM

## 2015-02-24 ENCOUNTER — Other Ambulatory Visit: Payer: BLUE CROSS/BLUE SHIELD

## 2015-02-26 ENCOUNTER — Encounter: Payer: BLUE CROSS/BLUE SHIELD | Admitting: Internal Medicine

## 2015-03-02 ENCOUNTER — Ambulatory Visit (INDEPENDENT_AMBULATORY_CARE_PROVIDER_SITE_OTHER): Payer: BLUE CROSS/BLUE SHIELD | Admitting: Internal Medicine

## 2015-03-02 ENCOUNTER — Encounter: Payer: Self-pay | Admitting: Internal Medicine

## 2015-03-02 VITALS — BP 124/86 | HR 59 | Temp 97.9°F | Ht 73.0 in | Wt 206.0 lb

## 2015-03-02 DIAGNOSIS — Z0000 Encounter for general adult medical examination without abnormal findings: Secondary | ICD-10-CM

## 2015-03-02 LAB — COMPREHENSIVE METABOLIC PANEL
ALK PHOS: 73 U/L (ref 39–117)
ALT: 29 U/L (ref 0–53)
AST: 22 U/L (ref 0–37)
Albumin: 4.4 g/dL (ref 3.5–5.2)
BILIRUBIN TOTAL: 0.4 mg/dL (ref 0.2–1.2)
BUN: 11 mg/dL (ref 6–23)
CALCIUM: 9.8 mg/dL (ref 8.4–10.5)
CO2: 29 mEq/L (ref 19–32)
Chloride: 104 mEq/L (ref 96–112)
Creatinine, Ser: 1 mg/dL (ref 0.40–1.50)
GFR: 83.71 mL/min (ref >60.00)
Glucose, Bld: 94 mg/dL (ref 70–99)
Potassium: 4.4 mEq/L (ref 3.5–5.1)
Sodium: 140 mEq/L (ref 135–145)
TOTAL PROTEIN: 7.1 g/dL (ref 6.0–8.3)

## 2015-03-02 LAB — CBC
HCT: 46.6 % (ref 39.0–52.0)
HEMOGLOBIN: 15.4 g/dL (ref 13.0–17.0)
MCHC: 33.1 g/dL (ref 30.0–36.0)
MCV: 88.5 fl (ref 78.0–100.0)
PLATELETS: 233 10*3/uL (ref 150.0–400.0)
RBC: 5.27 Mil/uL (ref 4.22–5.81)
RDW: 14.2 % (ref 11.5–15.5)
WBC: 7.3 10*3/uL (ref 4.0–10.5)

## 2015-03-02 LAB — LIPID PANEL
CHOL/HDL RATIO: 4
CHOLESTEROL: 193 mg/dL (ref 0–200)
HDL: 46.3 mg/dL (ref >39.00)
LDL Cholesterol: 114 mg/dL — ABNORMAL HIGH (ref 0–99)
NonHDL: 146.57
TRIGLYCERIDES: 162 mg/dL — AB (ref 0.0–149.0)
VLDL: 32.4 mg/dL (ref 0.0–40.0)

## 2015-03-02 LAB — HEMOGLOBIN A1C: Hgb A1c MFr Bld: 5.9 % (ref 4.6–6.5)

## 2015-03-02 LAB — PSA: PSA: 0.45 ng/mL (ref 0.10–4.00)

## 2015-03-02 NOTE — Progress Notes (Signed)
Pre visit review using our clinic review tool, if applicable. No additional management support is needed unless otherwise documented below in the visit note. 

## 2015-03-02 NOTE — Progress Notes (Signed)
Subjective:    Patient ID: David Rush, male    DOB: 10-23-1965, 50 y.o.   MRN: [id number]  HPI   David Rush is a 50 year old male who presents today for their physical exam.    Flu: never Tetanus: 01/2014 Colon Screening: 05/2014, 5 years PSA Screening: 01/2014 Vision: as needed Dental: annually  Diet: He does eat meat. He eats fruits and veggies daily. He does eat fried foods. He drinks mostly sweet tea or water. Exercise: None  Review of Systems  Past Medical History  Diagnosis Date  . Dyspnea     Current Outpatient Prescriptions  Medication Sig Dispense Refill  . meloxicam (MOBIC) 15 MG tablet   0   No current facility-administered medications for this visit.    No Known Allergies  Family History  Problem Relation Age of Onset  . Diabetes Maternal Grandmother   . Stroke Maternal Grandmother   . Cancer Neg Hx   . Heart disease Neg Hx   . Colon cancer Neg Hx     Social History   Social History  . Marital Status: Divorced    Spouse Name: N/A  . Number of Children: N/A  . Years of Education: N/A   Occupational History  . real Engineer, drilling    Social History Main Topics  . Smoking status: Never Smoker   . Smokeless tobacco: Never Used  . Alcohol Use: 1.8 oz/week    3 Cans of beer, 0 Standard drinks or equivalent per week     Comment: 3 beers/week  . Drug Use: No  . Sexual Activity: Yes   Other Topics Concern  . Not on file   Social History Narrative     Constitutional: Denies fever, malaise, fatigue, headache or abrupt weight changes.  HEENT: Denies eye pain, eye redness, ear pain, ringing in the ears, wax buildup, runny nose, nasal congestion, bloody nose, or sore throat. Respiratory: Denies difficulty breathing, shortness of breath, cough or sputum production.   Cardiovascular: Denies chest pain, chest tightness, palpitations or swelling in the hands or feet.  Gastrointestinal: Denies abdominal pain, bloating, constipation, diarrhea  or blood in the stool.  GU: Denies urgency, frequency, pain with urination, burning sensation, blood in urine, odor or discharge. Musculoskeletal: Denies decrease in range of motion, difficulty with gait, muscle pain or joint pain and swelling.  Skin: Denies redness, rashes, lesions or ulcercations.  Neurological: Denies dizziness, difficulty with memory, difficulty with speech or problems with balance and coordination.  Psych: Denies anxiety, depression, SI/HI.  No other specific complaints in a complete review of systems (except as listed in HPI above).      Objective:   Physical Exam  BP 124/86 mmHg  Pulse 59  Temp(Src) 97.9 F (36.6 C) (Oral)  Ht  (1.854 m)  Wt 206 lb (93.441 kg)  BMI 27.18 kg/m2  SpO2 98% Wt Readings from Last 3 Encounters:  03/02/15 206 lb (93.441 kg)  05/06/14 215 lb (97.523 kg)  04/22/14 215 lb (97.523 kg)    General: Appears his stated age, well developed, well nourished in NAD. Skin: Warm, dry and intact. No rashes, lesions or ulcerations noted. HEENT: Head: normal shape and size; Eyes: sclera white, no icterus, conjunctiva pink, PERRLA and EOMs intact; Ears: Tm's gray and intact, normal light reflex; Throat/Mouth: Teeth present, mucosa pink and moist, no exudate, lesions or ulcerations noted.  Neck:  Neck supple, trachea midline. No masses, lumps or thyromegaly present.  Cardiovascular: Normal rate and rhythm.  S1,S2 noted.  No murmur, rubs or gallops noted. No JVD or BLE edema. No carotid bruits noted. Pulmonary/Chest: Normal effort and positive vesicular breath sounds. No respiratory distress. No wheezes, rales or ronchi noted.  Abdomen: Soft and nontender. Normal bowel sounds. No distention or masses noted. Liver, spleen and kidneys non palpable. Musculoskeletal: Normal range of motion. No signs of joint swelling. No difficulty with gait.  Neurological: Alert and oriented. Cranial nerves II-XII grossly intact. Coordination normal.  Psychiatric:  Mood and affect normal. Behavior is normal. Judgment and thought content normal.     BMET    Component Value Date/Time   NA 140 02/11/2014 0911   NA 139 09/22/2013 0825   K 4.2 02/11/2014 0911   CL 104 02/11/2014 0911   CO2 30 02/11/2014 0911   GLUCOSE 84 02/11/2014 0911   GLUCOSE 104* 09/22/2013 0825   BUN 12 02/11/2014 0911   BUN 14 09/22/2013 0825   CREATININE 1.06 02/11/2014 0911   CALCIUM 9.7 02/11/2014 0911   GFRNONAA 95 09/22/2013 0825   GFRAA 110 09/22/2013 0825    Lipid Panel     Component Value Date/Time   CHOL 162 02/11/2014 0911   TRIG 233.0* 02/11/2014 0911   HDL 35.30* 02/11/2014 0911   CHOLHDL 5 02/11/2014 0911   VLDL 46.6* 02/11/2014 0911    CBC    Component Value Date/Time   WBC 6.9 02/11/2014 0911   WBC 6.2 09/22/2013 0825   RBC 5.06 02/11/2014 0911   RBC 5.25 09/22/2013 0825   HGB 14.9 02/11/2014 0911   HCT 44.9 02/11/2014 0911   PLT 253.0 02/11/2014 0911   MCV 88.7 02/11/2014 0911   MCH 29.3 09/22/2013 0825   MCHC 33.2 02/11/2014 0911   MCHC 33.8 09/22/2013 0825   RDW 14.3 02/11/2014 0911   RDW 14.1 09/22/2013 0825   LYMPHSABS 2.3 09/22/2013 0825   LYMPHSABS 3.2 08/05/2013 1651   MONOABS 0.7 08/05/2013 1651   EOSABS 0.1 09/22/2013 0825   EOSABS 0.1 08/05/2013 1651   BASOSABS 0.0 09/22/2013 0825   BASOSABS 0.0 08/05/2013 1651    Hgb A1C No results found for: HGBA1C      Assessment & Plan:   Preventative Health Maintenance:  He declines flu shot Tetanus UTD Colon Screening UTD, repeat in 4 years  Will check CBC, CMET, Lipid, A1C, PSA and HIV today Encouraged him to consume a balanced diet and exercise regimen Advised him to see an eye doctor and dentist annually  RTC in 1 year for annual exam

## 2015-03-02 NOTE — Addendum Note (Signed)
Addended by: Alvina Chou on: 03/02/2015 09:34 AM   Modules accepted: Orders

## 2015-03-02 NOTE — Patient Instructions (Signed)

## 2015-03-03 LAB — HIV ANTIBODY (ROUTINE TESTING W REFLEX): HIV 1&2 Ab, 4th Generation: NONREACTIVE

## 2015-07-28 ENCOUNTER — Telehealth: Payer: Self-pay

## 2015-07-28 NOTE — Telephone Encounter (Signed)
It is cheaper to send RX under 1 person name instead of paying 2 copays. I sent his wife in 10 patches, they can split them.

## 2015-07-28 NOTE — Telephone Encounter (Signed)
David Rush left v/m;Pt is going on cruise next week and request motion sickness [facility]. David Rush request cb when called in. Last seen 03/02/2015.

## 2018-10-17 DIAGNOSIS — M62830 Muscle spasm of back: Secondary | ICD-10-CM | POA: Diagnosis not present

## 2018-10-17 DIAGNOSIS — M9902 Segmental and somatic dysfunction of thoracic region: Secondary | ICD-10-CM | POA: Diagnosis not present

## 2018-10-17 DIAGNOSIS — M5412 Radiculopathy, cervical region: Secondary | ICD-10-CM | POA: Diagnosis not present

## 2018-10-17 DIAGNOSIS — M9901 Segmental and somatic dysfunction of cervical region: Secondary | ICD-10-CM | POA: Diagnosis not present

## 2019-08-14 ENCOUNTER — Ambulatory Visit
Admission: EM | Admit: 2019-08-14 | Discharge: 2019-08-14 | Disposition: A | Discharge status: Home / Self Care | Payer: BLUE CROSS/BLUE SHIELD | Attending: Family Medicine | Admitting: Family Medicine

## 2019-08-14 DIAGNOSIS — R52 Pain, unspecified: Secondary | ICD-10-CM

## 2019-08-14 DIAGNOSIS — R059 Cough, unspecified: Secondary | ICD-10-CM

## 2019-08-14 DIAGNOSIS — S40862A Insect bite (nonvenomous) of left upper arm, initial encounter: Secondary | ICD-10-CM

## 2019-08-14 DIAGNOSIS — W57XXXA Bitten or stung by nonvenomous insect and other nonvenomous arthropods, initial encounter: Secondary | ICD-10-CM

## 2019-08-14 DIAGNOSIS — R6883 Chills (without fever): Secondary | ICD-10-CM | POA: Diagnosis not present

## 2019-08-14 DIAGNOSIS — R509 Fever, unspecified: Secondary | ICD-10-CM | POA: Diagnosis not present

## 2019-08-14 DIAGNOSIS — R5383 Other fatigue: Secondary | ICD-10-CM | POA: Diagnosis not present

## 2019-08-14 DIAGNOSIS — R05 Cough: Secondary | ICD-10-CM | POA: Diagnosis not present

## 2019-08-14 DIAGNOSIS — L03114 Cellulitis of left upper limb: Secondary | ICD-10-CM

## 2019-08-14 LAB — POCT FASTING CBG KUC MANUAL ENTRY: POCT Glucose (KUC): 96 mg/dL (ref 70–99)

## 2019-08-14 MED ORDER — DOXYCYCLINE HYCLATE 100 MG PO CAPS: 100.0000 mg | ORAL_CAPSULE | Freq: Two times a day (BID) | ORAL | 0 refills | Status: DC

## 2019-08-14 NOTE — ED Provider Notes (Addendum)
[facility]   [id number] 08/14/19 Arrival Time: 1019   CC: COVID symptoms  SUBJECTIVE: History from: patient.  David Rush is a 54 y.o. male who presents with abrupt onset of nasal congestion, PND, chills, body aches and persistent dry cough for 4-5 days. Denies sick exposure to COVID, flu or strep. Denies recent travel. Has not taken OTC medications for this. There are no aggravating symptoms. Denies previous symptoms in the past. Also reports that he has a bug bite to his right upper inner arm that has been there for 2-3 weeks. Did not see what bit him. Reports the area is red, warm and tender. Denies fever, chills, fatigue, sinus pain, rhinorrhea, sore throat, SOB, wheezing, chest pain, nausea, changes in bowel or bladder habits.    ROS: As per HPI.  All other pertinent ROS negative.     Past Medical History:  Diagnosis Date  . Dyspnea    Past Surgical History:  Procedure Laterality Date  . CARDIAC CATHETERIZATION  2015  . LASIK    . PATELLAR TENDON REPAIR     left   No Known Allergies No current facility-administered medications on file prior to encounter.   Current Outpatient Medications on File Prior to Encounter  Medication Sig Dispense Refill  . meloxicam (MOBIC) 15 MG tablet Reported on 03/02/2015  0   Social History   Socioeconomic History  . Marital status: Divorced    Spouse name: Not on file  . Number of children: Not on file  . Years of education: Not on file  . Highest education level: Not on file  Occupational History  . Occupation: Naval architect  Tobacco Use  . Smoking status: Never Smoker  . Smokeless tobacco: Never Used  Vaping Use  . Vaping Use: Never used  Substance and Sexual Activity  . Alcohol use: Yes    Alcohol/week: 3.0 standard drinks    Types: 3 Cans of beer per week    Comment: 3 beers/week  . Drug use: No  . Sexual activity: Yes  Other Topics Concern  . Not on file  Social History Narrative  . Not on  file   Social Determinants of Health   Financial Resource Strain:   . Difficulty of Paying Living Expenses:   Food Insecurity:   . Worried About Programme researcher, broadcasting/film/video in the Last Year:   . Barista in the Last Year:   Transportation Needs:   . Freight forwarder (Medical):   Marland Kitchen Lack of Transportation (Non-Medical):   Physical Activity:   . Days of Exercise per Week:   . Minutes of Exercise per Session:   Stress:   . Feeling of Stress :   Social Connections:   . Frequency of Communication with Friends and Family:   . Frequency of Social Gatherings with Friends and Family:   . Attends Religious Services:   . Active Member of Clubs or Organizations:   . Attends Banker Meetings:   Marland Kitchen Marital Status:   Intimate Partner Violence:   . Fear of Current or Ex-Partner:   . Emotionally Abused:   Marland Kitchen Physically Abused:   . Sexually Abused:    Family History  Problem Relation Age of Onset  . Diabetes Maternal Grandmother   . Stroke Maternal Grandmother   . Cancer Neg Hx   . Heart disease Neg Hx   . Colon cancer Neg Hx     OBJECTIVE:  Vitals:   08/14/19 1022  BP: 129/78  Pulse: 77  Resp: 14  Temp: 98.3 F (36.8 C)  SpO2: 94%     General appearance: alert; appears fatigued, but nontoxic; speaking in full sentences and tolerating own secretions HEENT: NCAT; Ears: EACs clear, TMs pearly gray; Eyes: PERRL.  EOM grossly intact. Sinuses: nontender; Nose: nares patent without rhinorrhea, Throat: oropharynx clear, tonsils non erythematous or enlarged, uvula midline  Neck: supple without LAD Lungs: unlabored respirations, symmetrical air entry; cough: mild; no respiratory distress; CTAB Heart: regular rate and rhythm.  Radial pulses 2+ symmetrical bilaterally Skin: warm and dry, right upper arm with erythema, tenderness, warmth consistent with cellulitis Psychological: alert and cooperative; normal mood and affect  LABS:  No results found for this or any  previous visit (from the past 24 hour(s)).   ASSESSMENT & PLAN:  1. Cough   2. Chills   3. Body aches   4. Other fatigue   5. Insect bite of left upper arm, initial encounter   6. Cellulitis of left upper extremity     Meds ordered this encounter  Medications  . doxycycline (VIBRAMYCIN) 100 MG capsule    Sig: Take 1 capsule (100 mg total) by mouth 2 (two) times daily.    Dispense:  14 capsule    Refill:  0    Order Specific Question:   Supervising Provider    Answer:   Merrilee Jansky [id number]   Cellulitis Bug bite to upper inner left arm Erythematous, warm, tender to touch This could be the cause of your symptoms vs viral illness Will check CBC, CMP, CBG to help differentiate Prescribed doxycycline   COVID testing ordered.  It will take between 1-2 days for test results.  Someone will contact you regarding abnormal results.    Patient should remain in quarantine until they have received Covid results.  If negative you may resume normal activities (go back to work/school) while practicing hand hygiene, social distance, and mask wearing.  If positive, patient should remain in quarantine for 10 days from symptom onset AND greater than 72 hours after symptoms resolution (absence of fever without the use of fever-reducing medication and improvement in respiratory symptoms), whichever is longer Get plenty of rest and push fluids Use OTC zyrtec for nasal congestion, runny nose, and/or sore throat Use OTC flonase for nasal congestion and runny nose Use medications daily for symptom relief Use OTC medications like ibuprofen or tylenol as needed fever or pain Call or go to the ED if you have any new or worsening symptoms such as fever, worsening cough, shortness of breath, chest tightness, chest pain, turning blue, changes in mental status.  Reviewed expectations re: course of current medical issues. Questions answered. Outlined signs and symptoms indicating need for more acute  intervention. Patient verbalized understanding. After Visit Summary given.         Moshe Cipro, NP 08/14/19 1048    Moshe Cipro, NP 08/14/19 1049

## 2019-08-14 NOTE — Discharge Instructions (Addendum)
I have also sent in doxycycline for you to take twice a day for one week  Your COVID test is pending.  You should self quarantine until the test result is back.    Take Tylenol as needed for fever or discomfort.  Rest and keep yourself hydrated.    Go to the emergency department if you develop shortness of breath, severe diarrhea, high fever not relieved by Tylenol or ibuprofen, or other concerning symptoms.

## 2019-08-14 NOTE — ED Triage Notes (Signed)
Patient reports feeling fatigued, intermittent chills, intermittent body aches, and head pressure when coughing x4-5 days. No concerns for COVID.   Denies: fevers, chest pain, ShOB, ear pain  OTC: none

## 2019-08-15 LAB — CBC WITH DIFFERENTIAL/PLATELET
Basophils Absolute: 0 10*3/uL (ref 0.0–0.2)
Basos: 1 %
EOS (ABSOLUTE): 0 10*3/uL (ref 0.0–0.4)
Eos: 0 %
Hematocrit: 45.1 % (ref 37.5–51.0)
Hemoglobin: 15.3 g/dL (ref 13.0–17.7)
Immature Grans (Abs): 0 10*3/uL (ref 0.0–0.1)
Immature Granulocytes: 0 %
Lymphocytes Absolute: 2 10*3/uL (ref 0.7–3.1)
Lymphs: 34 %
MCH: 29.7 pg (ref 26.6–33.0)
MCHC: 33.9 g/dL (ref 31.5–35.7)
MCV: 88 fL (ref 79–97)
Monocytes Absolute: 0.6 10*3/uL (ref 0.1–0.9)
Monocytes: 10 %
Neutrophils Absolute: 3.1 10*3/uL (ref 1.4–7.0)
Neutrophils: 55 %
Platelets: 156 10*3/uL (ref 150–450)
RBC: 5.15 x10E6/uL (ref 4.14–5.80)
RDW: 13.6 % (ref 11.6–15.4)
WBC: 5.7 10*3/uL (ref 3.4–10.8)

## 2019-08-15 LAB — COMPREHENSIVE METABOLIC PANEL
ALT: 53 IU/L — ABNORMAL HIGH (ref 0–44)
AST: 44 IU/L — ABNORMAL HIGH (ref 0–40)
Albumin/Globulin Ratio: 1.6 (ref 1.2–2.2)
Albumin: 4.6 g/dL (ref 3.8–4.9)
Alkaline Phosphatase: 129 IU/L — ABNORMAL HIGH (ref 48–121)
BUN/Creatinine Ratio: 9 (ref 9–20)
BUN: 9 mg/dL (ref 6–24)
Bilirubin Total: 0.3 mg/dL (ref 0.0–1.2)
CO2: 23 mmol/L (ref 20–29)
Calcium: 9.4 mg/dL (ref 8.7–10.2)
Chloride: 102 mmol/L (ref 96–106)
Creatinine, Ser: 1.03 mg/dL (ref 0.76–1.27)
GFR calc Af Amer: 94 mL/min/{1.73_m2} (ref >59)
GFR calc non Af Amer: 81 mL/min/{1.73_m2} (ref >59)
Globulin, Total: 2.8 g/dL (ref 1.5–4.5)
Glucose: 98 mg/dL (ref 65–99)
Potassium: 4.2 mmol/L (ref 3.5–5.2)
Sodium: 138 mmol/L (ref 134–144)
Total Protein: 7.4 g/dL (ref 6.0–8.5)

## 2019-08-15 LAB — NOVEL CORONAVIRUS, NAA: SARS-CoV-2, NAA: NOT DETECTED

## 2019-08-15 LAB — SARS-COV-2, NAA 2 DAY TAT

## 2019-09-23 ENCOUNTER — Ambulatory Visit (INDEPENDENT_AMBULATORY_CARE_PROVIDER_SITE_OTHER): Payer: BLUE CROSS/BLUE SHIELD | Admitting: Internal Medicine

## 2019-09-23 ENCOUNTER — Encounter: Payer: Self-pay | Admitting: Internal Medicine

## 2019-09-23 ENCOUNTER — Other Ambulatory Visit: Payer: Self-pay

## 2019-09-23 VITALS — BP 124/80 | HR 60 | Temp 98.3°F | Wt 202.0 lb

## 2019-09-23 DIAGNOSIS — R7989 Other specified abnormal findings of blood chemistry: Secondary | ICD-10-CM

## 2019-09-23 DIAGNOSIS — Z125 Encounter for screening for malignant neoplasm of prostate: Secondary | ICD-10-CM

## 2019-09-23 DIAGNOSIS — R748 Abnormal levels of other serum enzymes: Secondary | ICD-10-CM

## 2019-09-23 DIAGNOSIS — Z0000 Encounter for general adult medical examination without abnormal findings: Secondary | ICD-10-CM | POA: Diagnosis not present

## 2019-09-23 LAB — LIPID PANEL
Cholesterol: 196 mg/dL (ref 0–200)
HDL: 48.3 mg/dL (ref >39.00)
NonHDL: 148.12
Total CHOL/HDL Ratio: 4
Triglycerides: 221 mg/dL — ABNORMAL HIGH (ref 0.0–149.0)
VLDL: 44.2 mg/dL — ABNORMAL HIGH (ref 0.0–40.0)

## 2019-09-23 LAB — LDL CHOLESTEROL, DIRECT: Direct LDL: 123 mg/dL

## 2019-09-23 LAB — PSA: PSA: 1.91 ng/mL (ref 0.10–4.00)

## 2019-09-23 LAB — HEMOGLOBIN A1C: Hgb A1c MFr Bld: 5.8 % (ref 4.6–6.5)

## 2019-09-23 NOTE — Patient Instructions (Signed)

## 2019-09-23 NOTE — Progress Notes (Signed)
HPI  Pt presents to the clinic today to reestablish care. He has not had a PCP in 3 years. He would like his annual exam today.  Flu: never Tetanus: 01/2014 Covid: never PSA Screening: 2017 Colon Screening: 05/2014, 10 years Vision Screening: as needed Dentist: biannually  Diet: He does eat meat. He consumes fruits and veggies daily. He does eat fried foods. He drinks mostly water. Exercise: None  Past Medical History:  Diagnosis Date  . Dyspnea     Current Outpatient Medications  Medication Sig Dispense Refill  . doxycycline (VIBRAMYCIN) 100 MG capsule Take 1 capsule (100 mg total) by mouth 2 (two) times daily. 14 capsule 0  . meloxicam (MOBIC) 15 MG tablet Reported on 03/02/2015  0   No current facility-administered medications for this visit.    No Known Allergies  Family History  Problem Relation Age of Onset  . Diabetes Maternal Grandmother   . Stroke Maternal Grandmother   . Cancer Neg Hx   . Heart disease Neg Hx   . Colon cancer Neg Hx     Social History   Socioeconomic History  . Marital status: Divorced    Spouse name: Not on file  . Number of children: Not on file  . Years of education: Not on file  . Highest education level: Not on file  Occupational History  . Occupation: Naval architect  Tobacco Use  . Smoking status: Never Smoker  . Smokeless tobacco: Never Used  Vaping Use  . Vaping Use: Never used  Substance and Sexual Activity  . Alcohol use: Yes    Alcohol/week: 3.0 standard drinks    Types: 3 Cans of beer per week    Comment: 3 beers/week  . Drug use: No  . Sexual activity: Yes  Other Topics Concern  . Not on file  Social History Narrative  . Not on file   Social Determinants of Health   Financial Resource Strain:   . Difficulty of Paying Living Expenses: Not on file  Food Insecurity:   . Worried About Programme researcher, broadcasting/film/video in the Last Year: Not on file  . Ran Out of Food in the Last Year: Not on file  Transportation Needs:    . Lack of Transportation (Medical): Not on file  . Lack of Transportation (Non-Medical): Not on file  Physical Activity:   . Days of Exercise per Week: Not on file  . Minutes of Exercise per Session: Not on file  Stress:   . Feeling of Stress : Not on file  Social Connections:   . Frequency of Communication with Friends and Family: Not on file  . Frequency of Social Gatherings with Friends and Family: Not on file  . Attends Religious Services: Not on file  . Active Member of Clubs or Organizations: Not on file  . Attends Banker Meetings: Not on file  . Marital Status: Not on file  Intimate Partner Violence:   . Fear of Current or Ex-Partner: Not on file  . Emotionally Abused: Not on file  . Physically Abused: Not on file  . Sexually Abused: Not on file    ROS:  Constitutional: Denies fever, malaise, fatigue, headache or abrupt weight changes.  HEENT: Pt reports intermittent blurred vision. Denies eye pain, eye redness, ear pain, ringing in the ears, wax buildup, runny nose, nasal congestion, bloody nose, or sore throat. Respiratory: Denies difficulty breathing, shortness of breath, cough or sputum production.   Cardiovascular: Denies chest pain, chest tightness,  palpitations or swelling in the hands or feet.  Gastrointestinal: Denies abdominal pain, bloating, constipation, diarrhea or blood in the stool.  GU: Denies frequency, urgency, pain with urination, blood in urine, odor or discharge. Musculoskeletal: Denies decrease in range of motion, difficulty with gait, muscle pain or joint pain and swelling.  Skin: Denies redness, rashes, lesions or ulcercations.  Neurological: Denies dizziness, difficulty with memory, difficulty with speech or problems with balance and coordination.  Psych: Denies anxiety, depression, SI/HI.  No other specific complaints in a complete review of systems (except as listed in HPI above).  PE:  BP 124/80   Pulse 60   Temp 98.3 F (36.8  C) (Temporal)   Wt 202 lb (91.6 kg)   SpO2 98%   BMI 26.65 kg/m   Wt Readings from Last 3 Encounters:  03/02/15 206 lb (93.4 kg)  05/06/14 215 lb (97.5 kg)  04/22/14 215 lb (97.5 kg)    General: Appears his stated age, well developed, well nourished in NAD. HEENT: Head: normal shape and size; Eyes: sclera white, no icterus, conjunctiva pink, PERRLA and EOMs intact;  Neck: Neck supple, trachea midline. No masses, lumps or thyromegaly present.  Cardiovascular: Normal rate and rhythm. S1,S2 noted.  No murmur, rubs or gallops noted. No JVD or BLE edema. No carotid bruits noted. Pulmonary/Chest: Normal effort and positive vesicular breath sounds. No respiratory distress. No wheezes, rales or ronchi noted.  Abdomen: Soft and nontender. Normal bowel sounds, no bruits noted. No distention or masses noted. Liver, spleen and kidneys non palpable. Musculoskeletal: Strength 5/5 BUE/BLE.  No difficulty with gait.  Neurological: Alert and oriented. Cranial nerves II-XII grossly intact. Coordination normal.  Psychiatric: Mood and affect normal. Behavior is normal. Judgment and thought content normal.     BMET    Component Value Date/Time   NA 138 08/14/2019 1053   K 4.2 08/14/2019 1053   CL 102 08/14/2019 1053   CO2 23 08/14/2019 1053   GLUCOSE 98 08/14/2019 1053   GLUCOSE 94 03/02/2015 0934   BUN 9 08/14/2019 1053   CREATININE 1.03 08/14/2019 1053   CALCIUM 9.4 08/14/2019 1053   GFRNONAA 81 08/14/2019 1053   GFRAA 94 08/14/2019 1053    Lipid Panel     Component Value Date/Time   CHOL 193 03/02/2015 0934   TRIG 162.0 (H) 03/02/2015 0934   HDL 46.30 03/02/2015 0934   CHOLHDL 4 03/02/2015 0934   VLDL 32.4 03/02/2015 0934   LDLCALC 114 (H) 03/02/2015 0934    CBC    Component Value Date/Time   WBC 5.7 08/14/2019 1053   WBC 7.3 03/02/2015 0934   RBC 5.15 08/14/2019 1053   RBC 5.27 03/02/2015 0934   HGB 15.3 08/14/2019 1053   HCT 45.1 08/14/2019 1053   PLT 156 08/14/2019 1053    MCV 88 08/14/2019 1053   MCH 29.7 08/14/2019 1053   MCHC 33.9 08/14/2019 1053   MCHC 33.1 03/02/2015 0934   RDW 13.6 08/14/2019 1053   LYMPHSABS 2.0 08/14/2019 1053   MONOABS 0.7 08/05/2013 1651   EOSABS 0.0 08/14/2019 1053   BASOSABS 0.0 08/14/2019 1053    Hgb A1C Lab Results  Component Value Date   HGBA1C 5.9 03/02/2015     Assessment and Plan:  Preventative Health Maintenance:  Encouraged him to get a flu shot in the fall Tetanus UTD He declines Covid vaccine Colon screening UTD Encouraged him to consume a balanced diet and exercise regimen Advised him to see an eye doctor and dentist  anually CBC and CMET reviewed. Will check lipid, A1C, PSA  Elevated Liver Enzymes:  Acute Hep Panel today Consider RUQ ultrasound pending labs  RTC in 1 year, sooner if  needed  Nicki Reaper, NP This visit occurred during the SARS-CoV-2 public health emergency.  Safety protocols were in place, including screening questions prior to the visit, additional usage of staff PPE, and extensive cleaning of exam room while observing appropriate contact time as indicated for disinfecting solutions.

## 2019-09-24 LAB — HEPATITIS PANEL, ACUTE
Hep A IgM: NONREACTIVE
Hep B C IgM: NONREACTIVE
Hepatitis B Surface Ag: NONREACTIVE
Hepatitis C Ab: NONREACTIVE
SIGNAL TO CUT-OFF: 0.02 (ref <1.00)

## 2020-03-16 ENCOUNTER — Other Ambulatory Visit: Payer: Self-pay

## 2020-03-16 ENCOUNTER — Ambulatory Visit (INDEPENDENT_AMBULATORY_CARE_PROVIDER_SITE_OTHER): Payer: BLUE CROSS/BLUE SHIELD | Admitting: Internal Medicine

## 2020-03-16 ENCOUNTER — Encounter: Payer: Self-pay | Admitting: Internal Medicine

## 2020-03-16 VITALS — BP 120/84 | HR 60 | Temp 97.8°F | Wt 201.0 lb

## 2020-03-16 DIAGNOSIS — R7303 Prediabetes: Secondary | ICD-10-CM | POA: Diagnosis not present

## 2020-03-16 DIAGNOSIS — E559 Vitamin D deficiency, unspecified: Secondary | ICD-10-CM

## 2020-03-16 DIAGNOSIS — Z3009 Encounter for other general counseling and advice on contraception: Secondary | ICD-10-CM

## 2020-03-16 NOTE — Patient Instructions (Signed)
Vasectomy, Care After This sheet gives you information about how to care for yourself after your procedure. Your health care provider may also give you more specific instructions. If you have problems or questions, contact your health care provider. What can I expect after the procedure? After the procedure, it is common to have:  Mild pain, swelling, or discomfort in your scrotum or redness on your scrotum.  Some blood coming from your incisions or puncture sites for 1 or 2 days.  Blood in your semen. Follow these instructions at home: Medicines  Take over-the-counter and prescription medicines only as told by your health care provider.  Avoid taking any medicines that contain aspirin or NSAIDs, such as ibuprofen. These medicines can make bleeding worse. Activity  For the first 2 days after surgery, avoid physical activity and exercise that requires a lot of energy. Ask your health care provider what activities are safe for you.  Do not take part in sports or perform heavy physical labor until your pain has improved, or until your health care provider says it is okay.  You may have limits on the amount of weight you can lift as told by your health care provider.  Do not ejaculate for at least 1 week after the procedure, or for as long as you are told.  You may resume sexual activity 7-10 days after your procedure, or when your health care provider approves. Use a different method of birth control (contraception) until you have had test results that confirm that there is no sperm in your semen. Scrotal support  Use scrotal support, such as a jockstrap or underwear with a supportive pouch, as needed for 1 week after your procedure.  If you feel discomfort in your scrotum, you may remove the scrotal support to see if the discomfort is relieved. Sometimes scrotal support can press on the scrotum and cause or worsen discomfort.  If your skin gets irritated, you may add some germ-free  (sterile), fluffed bandages or a clean washcloth to the scrotal support. Managing pain and swelling If directed, put ice on the affected area. To do this:  Put ice in a plastic bag.  Place a towel between your skin and the bag.  Leave the ice on for 20 minutes, 2-3 times a day.  Remove the ice if your skin turns bright red. This is very important. If you cannot feel pain, heat, or cold, you have a greater risk of damage to the area.   General instructions  Check your incisions or puncture sites every day for signs of infection. Check for: ? Redness, swelling, or more pain. ? Fluid or blood. ? Warmth. ? Pus or a bad smell.  Leave stitches (sutures) in place. The sutures will dissolve on their own and do not need to be removed.  Keep all follow-up visits. This is important because you will need a test to confirm that there is no sperm in your semen. Multiple ejaculations are needed to clear out sperm that were beyond the vasectomy site. You will need one test result showing that there is no sperm in your semen before you can resume unprotected sex. This may take 2-4 months after your procedure.  If you were given a sedative during the procedure, it can affect you for several hours. Do not drive or operate machinery until your health care provider says that it is safe.   Contact a health care provider if:  You have redness, swelling, or more pain around an incision   or puncture site, or in your scrotum area.  You have bleeding from an incision or puncture site.  You have pus or a bad smell coming from an incision or puncture site.  You have a fever.  An incision or puncture site opens up. Get help right away if:  You develop a rash.  You have trouble breathing. Summary  After your procedure, it is common to have mild pain, swelling, redness, or discomfort in your scrotum.  For the first 2 days after surgery, avoid physical activity and exercise that requires a lot of  energy.  Put ice on the affected area. Leave the ice on for 20 minutes, 2-3 times a day.  If you were given a sedative during the procedure, it can affect you for several hours. Do not drive or operate machinery until your health care provider says that it is safe. This information is not intended to replace advice given to you by your health care provider. Make sure you discuss any questions you have with your health care provider. Document Revised: 06/05/2019 Document Reviewed: 06/05/2019 Elsevier Patient Education  2021 Elsevier Inc.    

## 2020-03-16 NOTE — Progress Notes (Signed)
Subjective:    Patient ID: David Rush, male    DOB: July 07, 1965, 55 y.o.   MRN: [id number]  HPI  Pt presents to the clinic today to discuss labs he had done by [facility] while he was in [place]. Labs showed elevated Calcium, slightly low Vit D, elevated A1C of 5.9 and elevated insulin levels.  He would also like a referral to urology for consideration of sterilization. He already has 2 kids, 35 and 18.  Review of Systems   No past medical history on file.  No current outpatient medications on file.   No current facility-administered medications for this visit.    No Known Allergies  Family History  Problem Relation Age of Onset  . Diabetes Maternal Grandmother   . Stroke Maternal Grandmother   . Cancer Neg Hx   . Heart disease Neg Hx   . Colon cancer Neg Hx     Social History   Socioeconomic History  . Marital status: Divorced    Spouse name: Not on file  . Number of children: Not on file  . Years of education: Not on file  . Highest education level: Not on file  Occupational History  . Occupation: Naval architect  Tobacco Use  . Smoking status: Never Smoker  . Smokeless tobacco: Never Used  Vaping Use  . Vaping Use: Never used  Substance and Sexual Activity  . Alcohol use: Yes    Alcohol/week: 3.0 standard drinks    Types: 3 Cans of beer per week    Comment: 3 beers/week  . Drug use: No  . Sexual activity: Yes  Other Topics Concern  . Not on file  Social History Narrative  . Not on file   Social Determinants of Health   Financial Resource Strain: Not on file  Food Insecurity: Not on file  Transportation Needs: Not on file  Physical Activity: Not on file  Stress: Not on file  Social Connections: Not on file  Intimate Partner Violence: Not on file     Constitutional: Denies fever, malaise, fatigue, headache or abrupt weight changes.  Respiratory: Denies difficulty breathing, shortness of breath, cough or sputum production.    Cardiovascular: Denies chest pain, chest tightness, palpitations or swelling in the hands or feet.  Gastrointestinal: Denies abdominal pain, bloating, constipation, diarrhea or blood in the stool.  GU: Denies urgency, frequency, pain with urination, burning sensation, blood in urine, odor or discharge. Neurological: Denies dizziness, difficulty with memory, difficulty with speech or problems with balance and coordination.    No other specific complaints in a complete review of systems (except as listed in HPI above).     Objective:   Physical Exam  BP 120/84   Pulse 60   Temp 97.8 F (36.6 C) (Temporal)   Wt 201 lb (91.2 kg)   SpO2 98%   BMI 26.52 kg/m   Wt Readings from Last 3 Encounters:  09/23/19 202 lb (91.6 kg)  03/02/15 206 lb (93.4 kg)  05/06/14 215 lb (97.5 kg)    General: Appears his stated age, well developed, well nourished in NAD. Cardiovascular: Normal rate. Pulmonary/Chest: Normal effort.  Neurological: Alert and oriented.  Psychiatric: Mood and affect normal. Behavior is normal. Judgment and thought content normal.     BMET    Component Value Date/Time   NA 138 08/14/2019 1053   K 4.2 08/14/2019 1053   CL 102 08/14/2019 1053   CO2 23 08/14/2019 1053   GLUCOSE 98 08/14/2019 1053  GLUCOSE 94 03/02/2015 0934   BUN 9 08/14/2019 1053   CREATININE 1.03 08/14/2019 1053   CALCIUM 9.4 08/14/2019 1053   GFRNONAA 81 08/14/2019 1053   GFRAA 94 08/14/2019 1053    Lipid Panel     Component Value Date/Time   CHOL 196 09/23/2019 1209   TRIG 221.0 (H) 09/23/2019 1209   HDL 48.30 09/23/2019 1209   CHOLHDL 4 09/23/2019 1209   VLDL 44.2 (H) 09/23/2019 1209   LDLCALC 114 (H) 03/02/2015 0934    CBC    Component Value Date/Time   WBC 5.7 08/14/2019 1053   WBC 7.3 03/02/2015 0934   RBC 5.15 08/14/2019 1053   RBC 5.27 03/02/2015 0934   HGB 15.3 08/14/2019 1053   HCT 45.1 08/14/2019 1053   PLT 156 08/14/2019 1053   MCV 88 08/14/2019 1053   MCH 29.7  08/14/2019 1053   MCHC 33.9 08/14/2019 1053   MCHC 33.1 03/02/2015 0934   RDW 13.6 08/14/2019 1053   LYMPHSABS 2.0 08/14/2019 1053   MONOABS 0.7 08/05/2013 1651   EOSABS 0.0 08/14/2019 1053   BASOSABS 0.0 08/14/2019 1053    Hgb A1C Lab Results  Component Value Date   HGBA1C 5.8 09/23/2019            Assessment & Plan:   Desires Sterilization:  Referral to urology placed  Encounter to Interpret Lab Results, Vit D Def, Hypercalcemia, Prediabetes:  Advised him he could take Vit D3 5000 units daily, avoid calcium due to elevated calcium Consume a low carb diet, exercise for weight loss  Return precautions discussed  Nicki Reaper, NP This visit occurred during the SARS-CoV-2 public health emergency.  Safety protocols were in place, including screening questions prior to the visit, additional usage of staff PPE, and extensive cleaning of exam room while observing appropriate contact time as indicated for disinfecting solutions.

## 2020-04-12 DIAGNOSIS — Z3009 Encounter for other general counseling and advice on contraception: Secondary | ICD-10-CM | POA: Diagnosis not present

## 2020-05-20 DIAGNOSIS — Z302 Encounter for sterilization: Secondary | ICD-10-CM | POA: Diagnosis not present

## 2021-02-18 DIAGNOSIS — J069 Acute upper respiratory infection, unspecified: Secondary | ICD-10-CM | POA: Diagnosis not present

## 2021-03-22 ENCOUNTER — Telehealth: Payer: Self-pay

## 2021-03-22 ENCOUNTER — Other Ambulatory Visit: Payer: Self-pay

## 2021-03-22 ENCOUNTER — Ambulatory Visit
Admission: RE | Admit: 2021-03-22 | Discharge: 2021-03-22 | Disposition: A | Discharge status: Home / Self Care | Payer: BC Managed Care – PPO | Source: Ambulatory Visit | Attending: Internal Medicine | Admitting: Internal Medicine

## 2021-03-22 ENCOUNTER — Ambulatory Visit
Admission: RE | Admit: 2021-03-22 | Discharge: 2021-03-22 | Disposition: A | Discharge status: Home / Self Care | Payer: BC Managed Care – PPO | Attending: Internal Medicine | Admitting: Internal Medicine

## 2021-03-22 ENCOUNTER — Ambulatory Visit: Payer: BC Managed Care – PPO | Admitting: Internal Medicine

## 2021-03-22 ENCOUNTER — Encounter: Payer: Self-pay | Admitting: Internal Medicine

## 2021-03-22 VITALS — BP 134/83 | HR 75 | Temp 97.3°F | Ht 73.0 in | Wt 201.0 lb

## 2021-03-22 DIAGNOSIS — G8929 Other chronic pain: Secondary | ICD-10-CM | POA: Insufficient documentation

## 2021-03-22 DIAGNOSIS — M19012 Primary osteoarthritis, left shoulder: Secondary | ICD-10-CM | POA: Diagnosis not present

## 2021-03-22 DIAGNOSIS — E663 Overweight: Secondary | ICD-10-CM

## 2021-03-22 DIAGNOSIS — M25512 Pain in left shoulder: Secondary | ICD-10-CM | POA: Diagnosis not present

## 2021-03-22 DIAGNOSIS — M25511 Pain in right shoulder: Secondary | ICD-10-CM | POA: Diagnosis not present

## 2021-03-22 DIAGNOSIS — E785 Hyperlipidemia, unspecified: Secondary | ICD-10-CM | POA: Insufficient documentation

## 2021-03-22 DIAGNOSIS — Z6826 Body mass index (BMI) 26.0-26.9, adult: Secondary | ICD-10-CM | POA: Diagnosis not present

## 2021-03-22 DIAGNOSIS — M19011 Primary osteoarthritis, right shoulder: Secondary | ICD-10-CM | POA: Diagnosis not present

## 2021-03-22 DIAGNOSIS — R7303 Prediabetes: Secondary | ICD-10-CM | POA: Insufficient documentation

## 2021-03-22 NOTE — Telephone Encounter (Signed)
LMTCB 03/22/2021.  Pt needs to be evaluated first before ordering Xrays.  PEC please advise pt when he calls back.    Thanks,   -Mickel Baas

## 2021-03-22 NOTE — Telephone Encounter (Signed)
Copied from CRM [id number]. Topic: General - Other >> Mar 21, 2021 11:11 AM David Rush A wrote: Reason for CRM: The patient has called to requesting orders for imaging of both shoulders  The patient has experienced upper and mid arm discomfort for roughly two months  The patient has scheduled their physical for 03/29/21 and would like to review the imaging during the appt  Please contact further

## 2021-03-22 NOTE — Progress Notes (Signed)
Subjective:    Patient ID: David Rush, male    DOB: 03-27-1965, 56 y.o.   MRN: [id number]  HPI  Patient presents the clinic today with complaint of bilateral shoulder pain.  This started a few years ago.  He describes the pain as sharp. The pain is worse with movement, or laying on his shoulders. He denies numbness or tingling but has noticed some weakness of his upper extremities.  He denies neck pain.  He has tried stretching, massage and chiropractic care and Ibuprofen with minimal relief of symptoms.  He denies prior shoulder injury or surgery.  Review of Systems     No past medical history on file.  No current outpatient medications on file.   No current facility-administered medications for this visit.    No Known Allergies  Family History  Problem Relation Age of Onset   Diabetes Maternal Grandmother    Stroke Maternal Grandmother    Cancer Neg Hx    Heart disease Neg Hx    Colon cancer Neg Hx     Social History   Socioeconomic History   Marital status: Divorced    Spouse name: Not on file   Number of children: Not on file   Years of education: Not on file   Highest education level: Not on file  Occupational History   Occupation: real Engineer, drilling  Tobacco Use   Smoking status: Never   Smokeless tobacco: Never  Vaping Use   Vaping Use: Never used  Substance and Sexual Activity   Alcohol use: Yes    Alcohol/week: 3.0 standard drinks    Types: 3 Cans of beer per week    Comment: 3 beers/week   Drug use: No   Sexual activity: Yes  Other Topics Concern   Not on file  Social History Narrative   Not on file   Social Determinants of Health   Financial Resource Strain: Not on file  Food Insecurity: Not on file  Transportation Needs: Not on file  Physical Activity: Not on file  Stress: Not on file  Social Connections: Not on file  Intimate Partner Violence: Not on file     Constitutional: Denies fever, malaise, fatigue, headache or  abrupt weight changes.  Respiratory: Denies difficulty breathing, shortness of breath, cough or sputum production.   Cardiovascular: Denies chest pain, chest tightness, palpitations or swelling in the hands or feet.  Musculoskeletal: Patient reports bilateral shoulder pain and upper extremity weakness.  Denies decrease in range of motion, difficulty with gait, muscle pain or joint swelling.  Skin: Denies redness, rashes, lesions or ulcercations.  Neurological: Denies numbness, tingling or problems with coordination.    No other specific complaints in a complete review of systems (except as listed in HPI above).  Objective:   Physical Exam BP 134/83 (BP Location: Right Arm, Patient Position: Sitting, Cuff Size: Normal)    Pulse 75    Temp (!) 97.3 F (36.3 C) (Temporal)    Ht 6\' 1"  (1.854 m)    Wt 201 lb (91.2 kg)    SpO2 97%    BMI 26.52 kg/m   Wt Readings from Last 3 Encounters:  03/16/20 201 lb (91.2 kg)  09/23/19 202 lb (91.6 kg)  03/02/15 206 lb (93.4 kg)    General: Appears his stated age, overweight, in NAD. Skin: Warm, dry and intact.  Cardiovascular: Normal rate. Pulmonary/Chest: Normal effort. Musculoskeletal: Normal flexion, extension and rotation of the cervical spine.  Decreased abduction and internal rotation of  bilateral shoulders.  Normal abduction and internal rotation bilateral shoulders.  No pain with palpation of the shoulders.  Positive drop can test bilaterally.  Shoulder shrug equal.  Strength 5/5 BUE.  Handgrips equal.  No difficulty with gait.  Neurological: Alert and oriented.   BMET    Component Value Date/Time   NA 138 08/14/2019 1053   K 4.2 08/14/2019 1053   CL 102 08/14/2019 1053   CO2 23 08/14/2019 1053   GLUCOSE 98 08/14/2019 1053   GLUCOSE 94 03/02/2015 0934   BUN 9 08/14/2019 1053   CREATININE 1.03 08/14/2019 1053   CALCIUM 9.4 08/14/2019 1053   GFRNONAA 81 08/14/2019 1053   GFRAA 94 08/14/2019 1053    Lipid Panel     Component Value  Date/Time   CHOL 196 09/23/2019 1209   TRIG 221.0 (H) 09/23/2019 1209   HDL 48.30 09/23/2019 1209   CHOLHDL 4 09/23/2019 1209   VLDL 44.2 (H) 09/23/2019 1209   LDLCALC 114 (H) 03/02/2015 0934    CBC    Component Value Date/Time   WBC 5.7 08/14/2019 1053   WBC 7.3 03/02/2015 0934   RBC 5.15 08/14/2019 1053   RBC 5.27 03/02/2015 0934   HGB 15.3 08/14/2019 1053   HCT 45.1 08/14/2019 1053   PLT 156 08/14/2019 1053   MCV 88 08/14/2019 1053   MCH 29.7 08/14/2019 1053   MCHC 33.9 08/14/2019 1053   MCHC 33.1 03/02/2015 0934   RDW 13.6 08/14/2019 1053   LYMPHSABS 2.0 08/14/2019 1053   MONOABS 0.7 08/05/2013 1651   EOSABS 0.0 08/14/2019 1053   BASOSABS 0.0 08/14/2019 1053    Hgb A1C Lab Results  Component Value Date   HGBA1C 5.8 09/23/2019           Assessment & Plan:   Chronic Bilateral Shoulder Pain:  We will obtain x-ray of bilateral shoulders Consider PT versus MRI versus referral to Ortho pending x-rays Continue stretching, anti-inflammatories and chiropractic care as needed  We will follow-up after imaging with further recommendation and treatment plan  Nicki Reaper, NP This visit occurred during the SARS-CoV-2 public health emergency.  Safety protocols were in place, including screening questions prior to the visit, additional usage of staff PPE, and extensive cleaning of exam room while observing appropriate contact time as indicated for disinfecting solutions.

## 2021-03-22 NOTE — Assessment & Plan Note (Signed)
Encourage diet and exercise for weight 

## 2021-03-22 NOTE — Patient Instructions (Signed)
Shoulder Exercises Ask your health care provider which exercises are safe for you. Do exercises exactly as told by your health care provider and adjust them as directed. It is normal to feel mild stretching, pulling, tightness, or discomfort as you do these exercises. Stop right away if you feel sudden pain or your pain gets worse. Do not begin these exercises until told by your health care provider. Stretching exercises External rotation and abduction This exercise is sometimes called corner stretch. This exercise rotates your arm outward (external rotation) and moves your arm out from your body (abduction). Stand in a doorway with one of your feet slightly in front of the other. This is called a staggered stance. If you cannot reach your forearms to the door frame, stand facing a corner of a room. Choose one of the following positions as told by your health care provider: Place your hands and forearms on the door frame above your head. Place your hands and forearms on the door frame at the height of your head. Place your hands on the door frame at the height of your elbows. Slowly move your weight onto your front foot until you feel a stretch across your chest and in the front of your shoulders. Keep your head and chest upright and keep your abdominal muscles tight. Hold for __________ seconds. To release the stretch, shift your weight to your back foot. Repeat __________ times. Complete this exercise __________ times a day. Extension, standing Stand and hold a broomstick, a cane, or a similar object behind your back. Your hands should be a little wider than shoulder width apart. Your palms should face away from your back. Keeping your elbows straight and your shoulder muscles relaxed, move the stick away from your body until you feel a stretch in your shoulders (extension). Avoid shrugging your shoulders while you move the stick. Keep your shoulder blades tucked down toward the middle of your  back. Hold for __________ seconds. Slowly return to the starting position. Repeat __________ times. Complete this exercise __________ times a day. Range-of-motion exercises Pendulum  Stand near a wall or a surface that you can hold onto for balance. Bend at the waist and let your left / right arm hang straight down. Use your other arm to support you. Keep your back straight and do not lock your knees. Relax your left / right arm and shoulder muscles, and move your hips and your trunk so your left / right arm swings freely. Your arm should swing because of the motion of your body, not because you are using your arm or shoulder muscles. Keep moving your hips and trunk so your arm swings in the following directions, as told by your health care provider: Side to side. Forward and backward. In clockwise and counterclockwise circles. Continue each motion for __________ seconds, or for as long as told by your health care provider. Slowly return to the starting position. Repeat __________ times. Complete this exercise __________ times a day. Shoulder flexion, standing  Stand and hold a broomstick, a cane, or a similar object. Place your hands a little more than shoulder width apart on the object. Your left / right hand should be palm up, and your other hand should be palm down. Keep your elbow straight and your shoulder muscles relaxed. Push the stick up with your healthy arm to raise your left / right arm in front of your body, and then over your head until you feel a stretch in your shoulder (flexion). Avoid   shrugging your shoulder while you raise your arm. Keep your shoulder blade tucked down toward the middle of your back. Hold for __________ seconds. Slowly return to the starting position. Repeat __________ times. Complete this exercise __________ times a day. Shoulder abduction, standing Stand and hold a broomstick, a cane, or a similar object. Place your hands a little more than shoulder  width apart on the object. Your left / right hand should be palm up, and your other hand should be palm down. Keep your elbow straight and your shoulder muscles relaxed. Push the object across your body toward your left / right side. Raise your left / right arm to the side of your body (abduction) until you feel a stretch in your shoulder. Do not raise your arm above shoulder height unless your health care provider tells you to do that. If directed, raise your arm over your head. Avoid shrugging your shoulder while you raise your arm. Keep your shoulder blade tucked down toward the middle of your back. Hold for __________ seconds. Slowly return to the starting position. Repeat __________ times. Complete this exercise __________ times a day. Internal rotation  Place your left / right hand behind your back, palm up. Use your other hand to dangle an exercise band, a towel, or a similar object over your shoulder. Grasp the band with your left / right hand so you are holding on to both ends. Gently pull up on the band until you feel a stretch in the front of your left / right shoulder. The movement of your arm toward the center of your body is called internal rotation. Avoid shrugging your shoulder while you raise your arm. Keep your shoulder blade tucked down toward the middle of your back. Hold for __________ seconds. Release the stretch by letting go of the band and lowering your hands. Repeat __________ times. Complete this exercise __________ times a day. Strengthening exercises External rotation  Sit in a stable chair without armrests. Secure an exercise band to a stable object at elbow height on your left / right side. Place a soft object, such as a folded towel or a small pillow, between your left / right upper arm and your body to move your elbow about 4 inches (10 cm) away from your side. Hold the end of the exercise band so it is tight and there is no slack. Keeping your elbow pressed  against the soft object, slowly move your forearm out, away from your abdomen (external rotation). Keep your body steady so only your forearm moves. Hold for __________ seconds. Slowly return to the starting position. Repeat __________ times. Complete this exercise __________ times a day. Shoulder abduction  Sit in a stable chair without armrests, or stand up. Hold a __________ weight in your left / right hand, or hold an exercise band with both hands. Start with your arms straight down and your left / right palm facing in, toward your body. Slowly lift your left / right hand out to your side (abduction). Do not lift your hand above shoulder height unless your health care provider tells you that this is safe. Keep your arms straight. Avoid shrugging your shoulder while you do this movement. Keep your shoulder blade tucked down toward the middle of your back. Hold for __________ seconds. Slowly lower your arm, and return to the starting position. Repeat __________ times. Complete this exercise __________ times a day. Shoulder extension Sit in a stable chair without armrests, or stand up. Secure an exercise band   to a stable object in front of you so it is at shoulder height. Hold one end of the exercise band in each hand. Your palms should face each other. Straighten your elbows and lift your hands up to shoulder height. Step back, away from the secured end of the exercise band, until the band is tight and there is no slack. Squeeze your shoulder blades together as you pull your hands down to the sides of your thighs (extension). Stop when your hands are straight down by your sides. Do not let your hands go behind your body. Hold for __________ seconds. Slowly return to the starting position. Repeat __________ times. Complete this exercise __________ times a day. Shoulder row Sit in a stable chair without armrests, or stand up. Secure an exercise band to a stable object in front of you so it  is at waist height. Hold one end of the exercise band in each hand. Position your palms so that your thumbs are facing the ceiling (neutral position). Bend each of your elbows to a 90-degree angle (right angle) and keep your upper arms at your sides. Step back until the band is tight and there is no slack. Slowly pull your elbows back behind you. Hold for __________ seconds. Slowly return to the starting position. Repeat __________ times. Complete this exercise __________ times a day. Shoulder press-ups  Sit in a stable chair that has armrests. Sit upright, with your feet flat on the floor. Put your hands on the armrests so your elbows are bent and your fingers are pointing forward. Your hands should be about even with the sides of your body. Push down on the armrests and use your arms to lift yourself off the chair. Straighten your elbows and lift yourself up as much as you comfortably can. Move your shoulder blades down, and avoid letting your shoulders move up toward your ears. Keep your feet on the ground. As you get stronger, your feet should support less of your body weight as you lift yourself up. Hold for __________ seconds. Slowly lower yourself back into the chair. Repeat __________ times. Complete this exercise __________ times a day. Wall push-ups  Stand so you are facing a stable wall. Your feet should be about one arm-length away from the wall. Lean forward and place your palms on the wall at shoulder height. Keep your feet flat on the floor as you bend your elbows and lean forward toward the wall. Hold for __________ seconds. Straighten your elbows to push yourself back to the starting position. Repeat __________ times. Complete this exercise __________ times a day. This information is not intended to replace advice given to you by your health care provider. Make sure you discuss any questions you have with your healthcare provider. Document Revised: 05/10/2018 Document  Reviewed: 02/15/2018 Elsevier Patient Education  2022 [employer]  

## 2021-03-22 NOTE — Telephone Encounter (Signed)
Apt 03/22/2021 at 3pm  Thanks,   -Vernona Rieger

## 2021-03-24 ENCOUNTER — Telehealth: Payer: Self-pay

## 2021-03-24 NOTE — Telephone Encounter (Signed)
Pt would like to discuss please advise [phone]

## 2021-03-24 NOTE — Telephone Encounter (Signed)
Results were released to his MyChart.  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  At this point my recommendation would be either referral to physical therapy or we can refer him to orthopedics for further evaluation.  Please let me know what he would like to do.

## 2021-03-24 NOTE — Telephone Encounter (Signed)
Copied from [facility] [id number]. Topic: General - Other >> Mar 24, 2021  9:40 AM Valere Dross wrote: Reason for CRM: Pt called in wanting to get his imaging results, pt requested a call back.

## 2021-03-29 ENCOUNTER — Ambulatory Visit (INDEPENDENT_AMBULATORY_CARE_PROVIDER_SITE_OTHER): Payer: BC Managed Care – PPO | Admitting: Internal Medicine

## 2021-03-29 ENCOUNTER — Encounter: Payer: Self-pay | Admitting: Internal Medicine

## 2021-03-29 ENCOUNTER — Other Ambulatory Visit: Payer: Self-pay

## 2021-03-29 VITALS — BP 114/70 | HR 68 | Temp 97.1°F | Ht 73.0 in | Wt 202.0 lb

## 2021-03-29 DIAGNOSIS — E663 Overweight: Secondary | ICD-10-CM | POA: Diagnosis not present

## 2021-03-29 DIAGNOSIS — Z125 Encounter for screening for malignant neoplasm of prostate: Secondary | ICD-10-CM

## 2021-03-29 DIAGNOSIS — Z0001 Encounter for general adult medical examination with abnormal findings: Secondary | ICD-10-CM | POA: Diagnosis not present

## 2021-03-29 DIAGNOSIS — Z6826 Body mass index (BMI) 26.0-26.9, adult: Secondary | ICD-10-CM

## 2021-03-29 DIAGNOSIS — R7309 Other abnormal glucose: Secondary | ICD-10-CM | POA: Diagnosis not present

## 2021-03-29 DIAGNOSIS — G8929 Other chronic pain: Secondary | ICD-10-CM

## 2021-03-29 DIAGNOSIS — M25511 Pain in right shoulder: Secondary | ICD-10-CM | POA: Diagnosis not present

## 2021-03-29 DIAGNOSIS — M25512 Pain in left shoulder: Secondary | ICD-10-CM

## 2021-03-29 NOTE — Patient Instructions (Signed)

## 2021-03-29 NOTE — Progress Notes (Signed)
Subjective:    Patient ID: David Rush, male    DOB: 1965/05/18, 56 y.o.   MRN: [id number]  HPI  Patient presents to clinic today for his annual exam.  Flu: never Tetanus: 01/2014 COVID: never Shingrix: never PSA screening: 08/2019 Colon screening: 05/2014 Vision screening: as needed Dentist: annually  Diet: He does eat meats. He consumes fruits and veggies. He does eat some fried foods. He drinks mostly sugar free energy drinks, water, sweet tea. Exercise: None  Review of Systems     No past medical history on file.  No current outpatient medications on file.   No current facility-administered medications for this visit.    No Known Allergies  Family History  Problem Relation Age of Onset   Diabetes Maternal Grandmother    Stroke Maternal Grandmother    Cancer Neg Hx    Heart disease Neg Hx    Colon cancer Neg Hx     Social History   Socioeconomic History   Marital status: Divorced    Spouse name: Not on file   Number of children: Not on file   Years of education: Not on file   Highest education level: Not on file  Occupational History   Occupation: real Engineer, drilling  Tobacco Use   Smoking status: Never   Smokeless tobacco: Never  Vaping Use   Vaping Use: Never used  Substance and Sexual Activity   Alcohol use: Yes    Alcohol/week: 3.0 standard drinks    Types: 3 Cans of beer per week    Comment: 3 beers/week   Drug use: No   Sexual activity: Yes  Other Topics Concern   Not on file  Social History Narrative   Not on file   Social Determinants of Health   Financial Resource Strain: Not on file  Food Insecurity: Not on file  Transportation Needs: Not on file  Physical Activity: Not on file  Stress: Not on file  Social Connections: Not on file  Intimate Partner Violence: Not on file     Constitutional: Denies fever, malaise, fatigue, headache or abrupt weight changes.  HEENT: Denies eye pain, eye redness, ear pain, ringing in  the ears, wax buildup, runny nose, nasal congestion, bloody nose, or sore throat. Respiratory: Denies difficulty breathing, shortness of breath, cough or sputum production.   Cardiovascular: Denies chest pain, chest tightness, palpitations or swelling in the hands or feet.  Gastrointestinal: Denies abdominal pain, bloating, constipation, diarrhea or blood in the stool.  GU: Denies urgency, frequency, pain with urination, burning sensation, blood in urine, odor or discharge. Musculoskeletal: Patient reports bilateral shoulder pain.  Denies decrease in range of motion, difficulty with gait, muscle pain or joint swelling.  Skin: Denies redness, rashes, lesions or ulcercations.  Neurological: Denies dizziness, difficulty with memory, difficulty with speech or problems with balance and coordination.  Psych: Denies anxiety, depression, SI/HI.  No other specific complaints in a complete review of systems (except as listed in HPI above).  Objective:   Physical Exam  BP 114/70 (BP Location: Right Arm, Patient Position: Sitting, Cuff Size: Large)    Pulse 68    Temp (!) 97.1 F (36.2 C) (Temporal)    Ht 6\' 1"  (1.854 m)    Wt 202 lb (91.6 kg)    SpO2 98%    BMI 26.65 kg/m   Wt Readings from Last 3 Encounters:  03/22/21 201 lb (91.2 kg)  03/16/20 201 lb (91.2 kg)  09/23/19 202 lb (91.6 kg)  General: Appears his stated age, overweight, in NAD. Skin: Warm, dry and intact.  HEENT: Head: normal shape and size; Eyes: sclera white and EOMs intact;  Neck:  Neck supple, trachea midline. No masses, lumps or thyromegaly present.  Cardiovascular: Normal rate and rhythm. S1,S2 noted.  No murmur, rubs or gallops noted. No JVD or BLE edema. No carotid bruits noted. Pulmonary/Chest: Normal effort and positive vesicular breath sounds. No respiratory distress. No wheezes, rales or ronchi noted.  Abdomen: Soft and nontender. Normal bowel sounds. No distention or masses noted. Liver, spleen and kidneys non  palpable. Musculoskeletal: Strength 5/5 BUE/BLE. No difficulty with gait.  Neurological: Alert and oriented. Cranial nerves II-XII grossly intact. Coordination normal.  Psychiatric: Mood and affect normal. Behavior is normal. Judgment and thought content normal.    BMET    Component Value Date/Time   NA 138 08/14/2019 1053   K 4.2 08/14/2019 1053   CL 102 08/14/2019 1053   CO2 23 08/14/2019 1053   GLUCOSE 98 08/14/2019 1053   GLUCOSE 94 03/02/2015 0934   BUN 9 08/14/2019 1053   CREATININE 1.03 08/14/2019 1053   CALCIUM 9.4 08/14/2019 1053   GFRNONAA 81 08/14/2019 1053   GFRAA 94 08/14/2019 1053    Lipid Panel     Component Value Date/Time   CHOL 196 09/23/2019 1209   TRIG 221.0 (H) 09/23/2019 1209   HDL 48.30 09/23/2019 1209   CHOLHDL 4 09/23/2019 1209   VLDL 44.2 (H) 09/23/2019 1209   LDLCALC 114 (H) 03/02/2015 0934    CBC    Component Value Date/Time   WBC 5.7 08/14/2019 1053   WBC 7.3 03/02/2015 0934   RBC 5.15 08/14/2019 1053   RBC 5.27 03/02/2015 0934   HGB 15.3 08/14/2019 1053   HCT 45.1 08/14/2019 1053   PLT 156 08/14/2019 1053   MCV 88 08/14/2019 1053   MCH 29.7 08/14/2019 1053   MCHC 33.9 08/14/2019 1053   MCHC 33.1 03/02/2015 0934   RDW 13.6 08/14/2019 1053   LYMPHSABS 2.0 08/14/2019 1053   MONOABS 0.7 08/05/2013 1651   EOSABS 0.0 08/14/2019 1053   BASOSABS 0.0 08/14/2019 1053    Hgb A1C Lab Results  Component Value Date   HGBA1C 5.8 09/23/2019           Assessment & Plan:   Preventative Health Maintenance:  He declines flu shot Tetanus UTD Encouraged him to get his COVID-vaccine Discussed Shingrix vaccine, he will check coverage with his insurance company Colon screening UTD Encouraged him to consume a balanced diet and exercise regimen Advised him to see an eye doctor and dentist annually We will check CBC, c-Met, lipid, A1c and PSA  RTC in 1 year, sooner if needed Webb Silversmith, NP This visit occurred during the SARS-CoV-2  public health emergency.  Safety protocols were in place, including screening questions prior to the visit, additional usage of staff PPE, and extensive cleaning of exam room while observing appropriate contact time as indicated for disinfecting solutions.

## 2021-03-29 NOTE — Assessment & Plan Note (Signed)
Encourage diet and exercise for weight loss 

## 2021-03-30 LAB — CBC
HCT: 44.1 % (ref 38.5–50.0)
Hemoglobin: 15 g/dL (ref 13.2–17.1)
MCH: 30.2 pg (ref 27.0–33.0)
MCHC: 34 g/dL (ref 32.0–36.0)
MCV: 88.9 fL (ref 80.0–100.0)
MPV: 10.4 fL (ref 7.5–12.5)
Platelets: 251 10*3/uL (ref 140–400)
RBC: 4.96 10*6/uL (ref 4.20–5.80)
RDW: 13.3 % (ref 11.0–15.0)
WBC: 7.1 10*3/uL (ref 3.8–10.8)

## 2021-03-30 LAB — HEMOGLOBIN A1C
Hgb A1c MFr Bld: 5.6 % of total Hgb (ref <5.7)
Mean Plasma Glucose: 114 mg/dL
eAG (mmol/L): 6.3 mmol/L

## 2021-03-30 LAB — COMPLETE METABOLIC PANEL WITH GFR
AG Ratio: 1.7 (calc) (ref 1.0–2.5)
ALT: 23 U/L (ref 9–46)
AST: 21 U/L (ref 10–35)
Albumin: 4.5 g/dL (ref 3.6–5.1)
Alkaline phosphatase (APISO): 76 U/L (ref 35–144)
BUN: 11 mg/dL (ref 7–25)
CO2: 28 mmol/L (ref 20–32)
Calcium: 10.5 mg/dL — ABNORMAL HIGH (ref 8.6–10.3)
Chloride: 103 mmol/L (ref 98–110)
Creat: 1.03 mg/dL (ref 0.70–1.30)
Globulin: 2.6 g/dL (calc) (ref 1.9–3.7)
Glucose, Bld: 91 mg/dL (ref 65–99)
Potassium: 3.9 mmol/L (ref 3.5–5.3)
Sodium: 140 mmol/L (ref 135–146)
Total Bilirubin: 0.7 mg/dL (ref 0.2–1.2)
Total Protein: 7.1 g/dL (ref 6.1–8.1)
eGFR: 85 mL/min/{1.73_m2} (ref >=60)

## 2021-03-30 LAB — LIPID PANEL
Cholesterol: 206 mg/dL — ABNORMAL HIGH (ref <200)
HDL: 58 mg/dL (ref >=40)
LDL Cholesterol (Calc): 115 mg/dL (calc) — ABNORMAL HIGH
Non-HDL Cholesterol (Calc): 148 mg/dL (calc) — ABNORMAL HIGH (ref <130)
Total CHOL/HDL Ratio: 3.6 (calc) (ref <5.0)
Triglycerides: 209 mg/dL — ABNORMAL HIGH (ref <150)

## 2021-03-30 LAB — PSA: PSA: 0.68 ng/mL (ref <=4.00)

## 2021-07-20 DIAGNOSIS — R21 Rash and other nonspecific skin eruption: Secondary | ICD-10-CM | POA: Diagnosis not present

## 2021-07-21 ENCOUNTER — Ambulatory Visit: Payer: BC Managed Care – PPO | Admitting: Internal Medicine

## 2021-11-16 ENCOUNTER — Encounter: Payer: Self-pay | Admitting: Internal Medicine

## 2021-11-16 ENCOUNTER — Ambulatory Visit: Payer: BC Managed Care – PPO | Admitting: Internal Medicine

## 2021-11-16 VITALS — BP 136/84 | HR 63 | Temp 96.9°F | Ht 73.0 in | Wt 203.0 lb

## 2021-11-16 DIAGNOSIS — Z024 Encounter for examination for driving license: Secondary | ICD-10-CM

## 2021-11-16 NOTE — Progress Notes (Signed)
Commercial Driver Medical Examination   David Rush is a 56 y.o. male who presents today for a commercial driver fitness determination physical exam. The patient reports no problems. The following portions of the patient's history were reviewed and updated as appropriate: allergies, current medications, past family history, past medical history, past social history, past surgical history, and problem list.  Review of Systems   No past medical history on file.  No current outpatient medications on file.   No current facility-administered medications for this visit.    No Known Allergies  Family History  Problem Relation Age of Onset   Diabetes Maternal Grandmother    Stroke Maternal Grandmother    Cancer Neg Hx    Heart disease Neg Hx    Colon cancer Neg Hx     Social History   Socioeconomic History   Marital status: Divorced    Spouse name: Not on file   Number of children: Not on file   Years of education: Not on file   Highest education level: Not on file  Occupational History   Occupation: real Environmental education officer  Tobacco Use   Smoking status: Never   Smokeless tobacco: Never  Vaping Use   Vaping Use: Never used  Substance and Sexual Activity   Alcohol use: Yes    Alcohol/week: 3.0 standard drinks of alcohol    Types: 3 Cans of beer per week    Comment: 3 beers/week   Drug use: No   Sexual activity: Yes  Other Topics Concern   Not on file  Social History Narrative   Not on file   Social Determinants of Health   Financial Resource Strain: Not on file  Food Insecurity: Not on file  Transportation Needs: Not on file  Physical Activity: Not on file  Stress: Not on file  Social Connections: Not on file  Intimate Partner Violence: Not on file     Constitutional: Denies fever, malaise, fatigue, headache or abrupt weight changes.  HEENT: Denies eye pain, eye redness, ear pain, ringing in the ears, wax buildup, runny nose, nasal congestion, bloody  nose, or sore throat. Respiratory: Denies difficulty breathing, shortness of breath, cough or sputum production.   Cardiovascular: Denies chest pain, chest tightness, palpitations or swelling in the hands or feet.  Gastrointestinal: Denies abdominal pain, bloating, constipation, diarrhea or blood in the stool.  GU: Denies urgency, frequency, pain with urination, burning sensation, blood in urine, odor or discharge. Musculoskeletal: Denies decrease in range of motion, difficulty with gait, muscle pain or joint pain and swelling.  Skin: Denies redness, rashes, lesions or ulcercations.  Neurological: Denies dizziness, difficulty with memory, difficulty with speech or problems with balance and coordination.  Psych: Denies anxiety, depression, SI/HI.  No other specific complaints in a complete review of systems (except as listed in HPI above).   Objective:    Vision:  Uncorrected Corrected Horizontal Field of Vision  Right Eye 20/25  70 degrees  Left Eye  20/25  70 degrees  Both Eyes  16/01     Applicant can recognize and distinguish among traffic control signals and devices showing standard red, green, and amber colors.   Monocular Vision?: No   Hearing:        Right Ear  > 90ft     Left Ear  > 71ft       BP 136/84 (BP Location: Left Arm, Patient Position: Sitting, Cuff Size: Normal)   Pulse 63   Temp (!) 96.9 F (36.1 C) (  Temporal)   Ht 6\' 1"  (1.854 m)   Wt 203 lb (92.1 kg)   SpO2 99%   BMI 26.78 kg/m   Wt Readings from Last 3 Encounters:  03/29/21 202 lb (91.6 kg)  03/22/21 201 lb (91.2 kg)  03/16/20 201 lb (91.2 kg)    General: Appears his stated age, overweight, in NAD. Skin: Warm, dry and intact. No rashes, lesions or ulcerations noted. HEENT: Head: normal shape and size; Eyes: sclera white, no icterus, conjunctiva pink, PERRLA and EOMs intact; Ears: Tm's gray and intact, normal light reflex; Nose: mucosa pink and moist, septum midline; Throat/Mouth: Teeth present,  mucosa pink and moist, no exudate, lesions or ulcerations noted.  Neck:  Neck supple, trachea midline. No masses, lumps or thyromegaly present.  Cardiovascular: Normal rate and rhythm. S1,S2 noted.  No murmur, rubs or gallops noted. No JVD or BLE edema. No carotid bruits noted. Pulmonary/Chest: Normal effort and positive vesicular breath sounds. No respiratory distress. No wheezes, rales or ronchi noted.  Abdomen: Soft and nontender. Normal bowel sounds. No distention or masses noted. Liver, spleen and kidneys non palpable. Musculoskeletal: Normal range of motion. Strength 5/5 BUE/BLE.  No difficulty with gait.  Neurological: Alert and oriented. Cranial nerves II-XII grossly intact. Coordination normal.  Psychiatric: Mood and affect normal. Behavior is normal. Judgment and thought content normal.   BMET    Component Value Date/Time   NA 140 03/29/2021 1345   NA 138 08/14/2019 1053   K 3.9 03/29/2021 1345   CL 103 03/29/2021 1345   CO2 28 03/29/2021 1345   GLUCOSE 91 03/29/2021 1345   BUN 11 03/29/2021 1345   BUN 9 08/14/2019 1053   CREATININE 1.03 03/29/2021 1345   CALCIUM 10.5 (H) 03/29/2021 1345   GFRNONAA 81 08/14/2019 1053   GFRAA 94 08/14/2019 1053    Lipid Panel     Component Value Date/Time   CHOL 206 (H) 03/29/2021 1345   TRIG 209 (H) 03/29/2021 1345   HDL 58 03/29/2021 1345   CHOLHDL 3.6 03/29/2021 1345   VLDL 44.2 (H) 09/23/2019 1209   LDLCALC 115 (H) 03/29/2021 1345    CBC    Component Value Date/Time   WBC 7.1 03/29/2021 1345   RBC 4.96 03/29/2021 1345   HGB 15.0 03/29/2021 1345   HGB 15.3 08/14/2019 1053   HCT 44.1 03/29/2021 1345   HCT 45.1 08/14/2019 1053   PLT 251 03/29/2021 1345   PLT 156 08/14/2019 1053   MCV 88.9 03/29/2021 1345   MCV 88 08/14/2019 1053   MCH 30.2 03/29/2021 1345   MCHC 34.0 03/29/2021 1345   RDW 13.3 03/29/2021 1345   RDW 13.6 08/14/2019 1053   LYMPHSABS 2.0 08/14/2019 1053   MONOABS 0.7 08/05/2013 1651   EOSABS 0.0  08/14/2019 1053   BASOSABS 0.0 08/14/2019 1053    Hgb A1C Lab Results  Component Value Date   HGBA1C 5.6 03/29/2021        Labs:  Specific Gravity: 1.010 Protein: Negative Glucose: Negative Blood: Negative  Assessment:    Healthy male exam.  Meets standards in 27 CFR 391.41;  qualifies for 2 year certificate.    Plan:    Medical examiners certificate completed and printed. Return as needed.     Webb Silversmith, NP

## 2022-02-17 DIAGNOSIS — R300 Dysuria: Secondary | ICD-10-CM | POA: Diagnosis not present

## 2022-09-05 DIAGNOSIS — L570 Actinic keratosis: Secondary | ICD-10-CM | POA: Diagnosis not present

## 2022-09-05 DIAGNOSIS — D2261 Melanocytic nevi of right upper limb, including shoulder: Secondary | ICD-10-CM | POA: Diagnosis not present

## 2022-09-05 DIAGNOSIS — D2272 Melanocytic nevi of left lower limb, including hip: Secondary | ICD-10-CM | POA: Diagnosis not present

## 2022-09-05 DIAGNOSIS — D2262 Melanocytic nevi of left upper limb, including shoulder: Secondary | ICD-10-CM | POA: Diagnosis not present

## 2022-09-05 DIAGNOSIS — D225 Melanocytic nevi of trunk: Secondary | ICD-10-CM | POA: Diagnosis not present

## 2022-09-05 DIAGNOSIS — X32XXXA Exposure to sunlight, initial encounter: Secondary | ICD-10-CM | POA: Diagnosis not present

## 2023-08-26 IMAGING — CR DG SHOULDER 2+V*R*
3 series · 3 of 3 positions shown · non-contrast
Comparison: None.

CLINICAL DATA: Chronic shoulder pain

EXAM:
RIGHT SHOULDER - 2+ VIEW

[shoulder grashey]
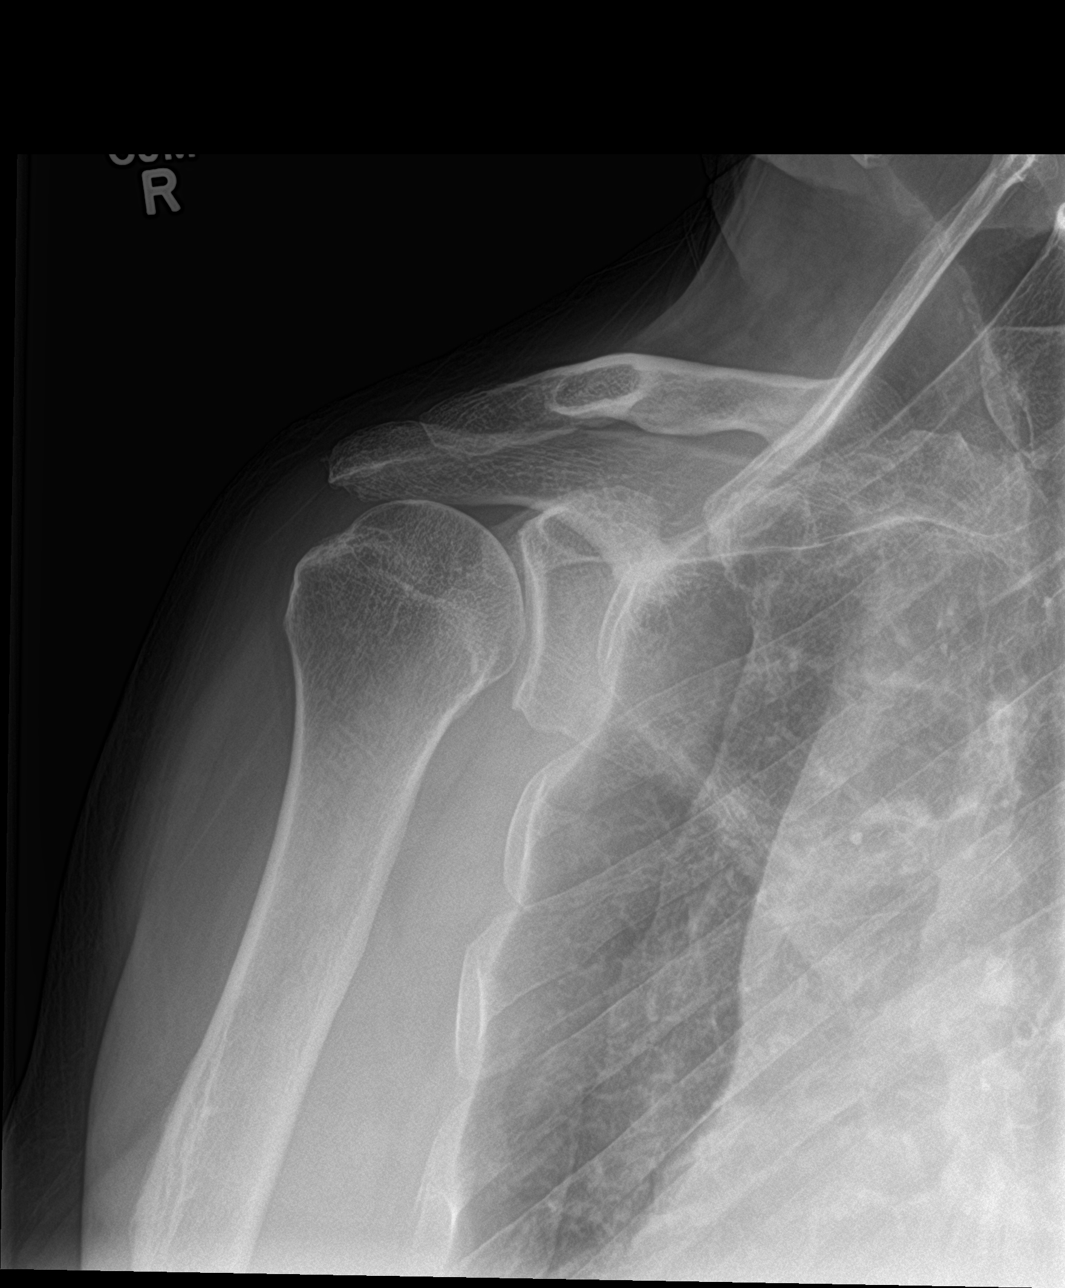

[shoulder axillary]
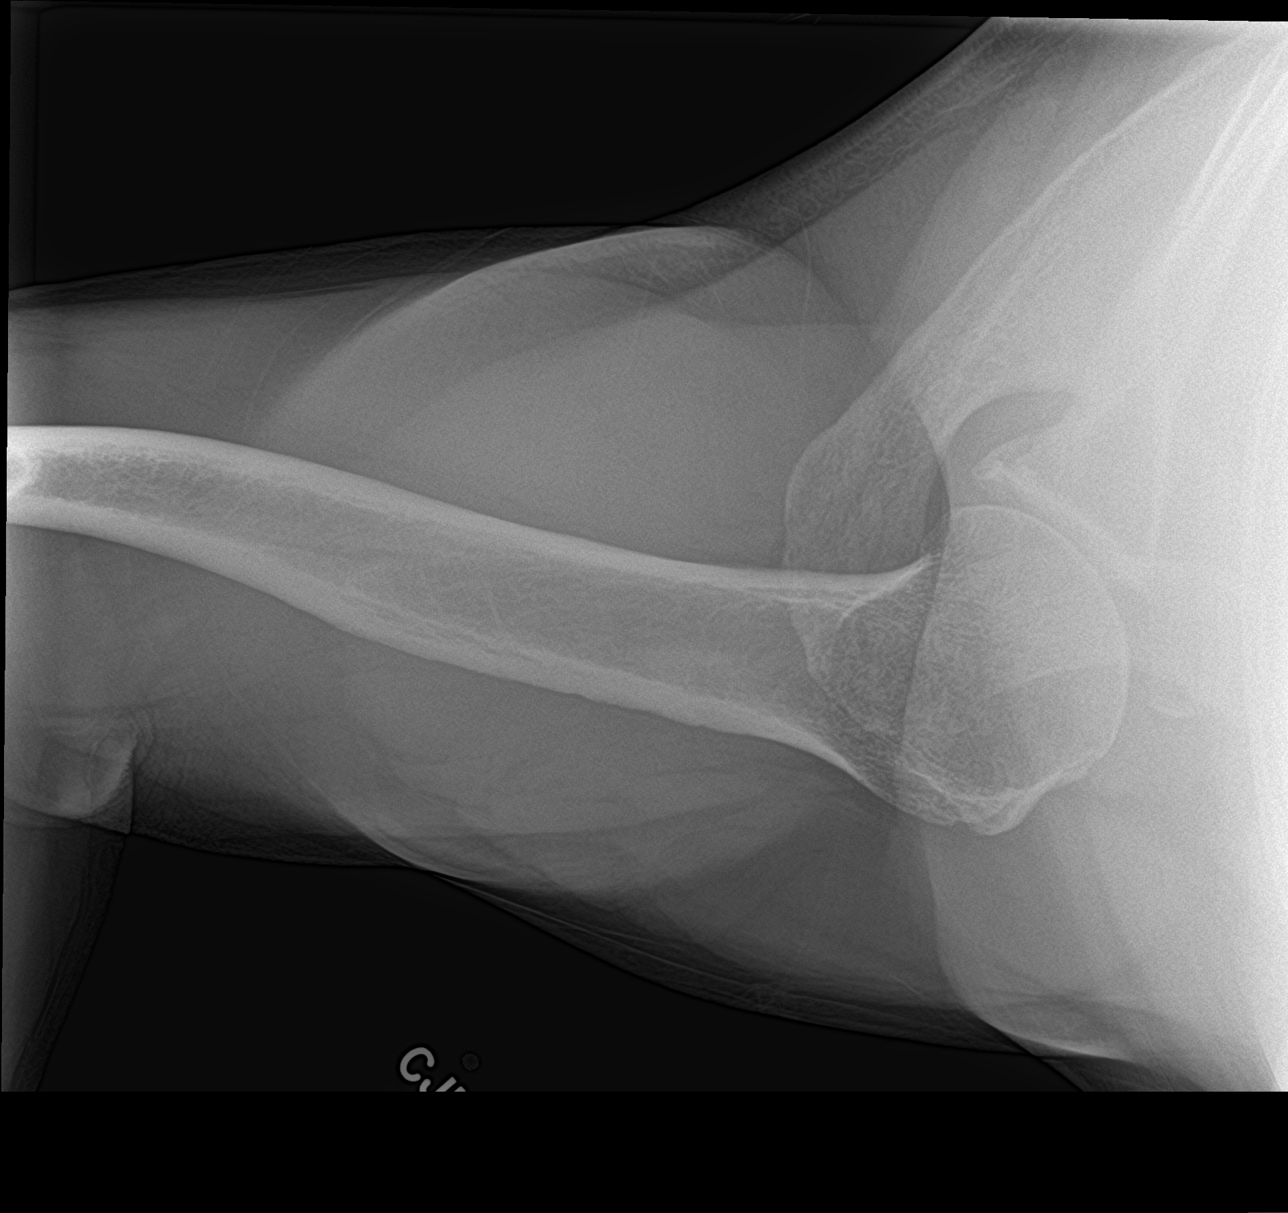

[shoulder y view]
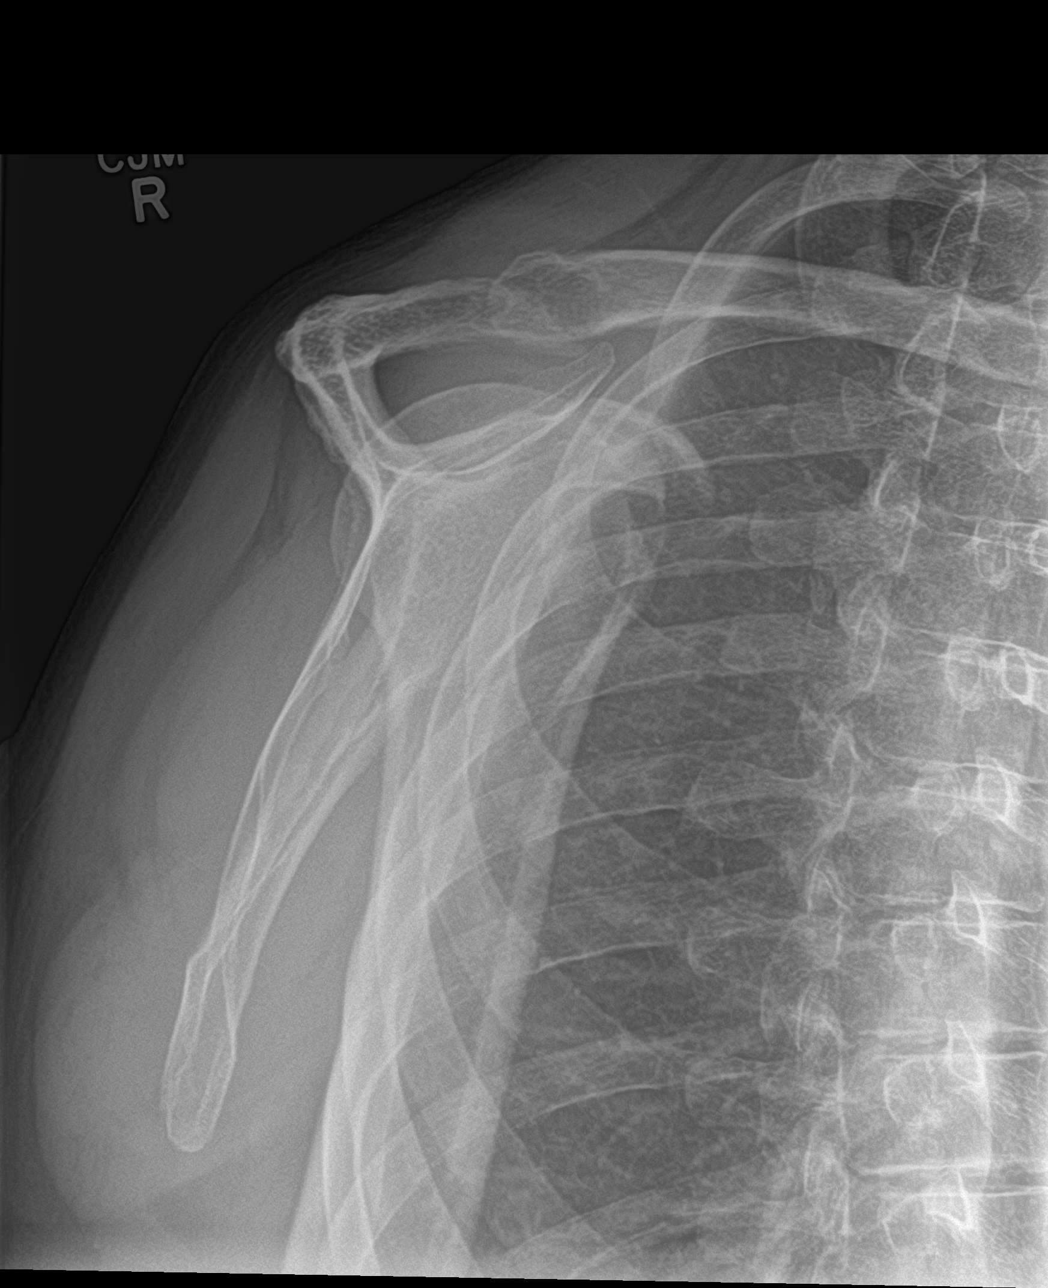

[3 of 3 positions shown; findings below may reference images not displayed]

FINDINGS: No fracture or malalignment. Mild AC joint and glenohumeral
degenerative change.
IMPRESSION: Mild degenerative changes

## 2023-09-26 DIAGNOSIS — L570 Actinic keratosis: Secondary | ICD-10-CM | POA: Diagnosis not present

## 2023-09-26 DIAGNOSIS — D225 Melanocytic nevi of trunk: Secondary | ICD-10-CM | POA: Diagnosis not present

## 2023-09-26 DIAGNOSIS — D2272 Melanocytic nevi of left lower limb, including hip: Secondary | ICD-10-CM | POA: Diagnosis not present

## 2023-09-26 DIAGNOSIS — D2261 Melanocytic nevi of right upper limb, including shoulder: Secondary | ICD-10-CM | POA: Diagnosis not present

## 2023-09-26 DIAGNOSIS — D2262 Melanocytic nevi of left upper limb, including shoulder: Secondary | ICD-10-CM | POA: Diagnosis not present

## 2023-12-06 ENCOUNTER — Encounter: Payer: Self-pay | Admitting: Internal Medicine

## 2023-12-06 ENCOUNTER — Ambulatory Visit (INDEPENDENT_AMBULATORY_CARE_PROVIDER_SITE_OTHER): Admitting: Internal Medicine

## 2023-12-06 VITALS — BP 128/74 | HR 83 | Ht 73.0 in | Wt 209.6 lb

## 2023-12-06 DIAGNOSIS — Z024 Encounter for examination for driving license: Secondary | ICD-10-CM

## 2023-12-06 NOTE — Progress Notes (Signed)
 Commercial Driver Medical Examination   David Rush is a 58 y.o. male who presents today for a commercial driver fitness determination physical exam. The patient reports no problems. The following portions of the patient's history were reviewed and updated as appropriate: allergies, current medications, past family history, past medical history, past social history, past surgical history, and problem list.  Review of Systems   No past medical history on file.  No current outpatient medications on file.   No current facility-administered medications for this visit.    No Known Allergies  Family History  Problem Relation Age of Onset   Diabetes Maternal Grandmother    Stroke Maternal Grandmother    Cancer Neg Hx    Heart disease Neg Hx    Colon cancer Neg Hx     Social History   Socioeconomic History   Marital status: Divorced    Spouse name: Not on file   Number of children: Not on file   Years of education: Not on file   Highest education level: Not on file  Occupational History   Occupation: real engineer, drilling  Tobacco Use   Smoking status: Never   Smokeless tobacco: Never  Vaping Use   Vaping status: Never Used  Substance and Sexual Activity   Alcohol use: Yes    Alcohol/week: 3.0 standard drinks of alcohol    Types: 3 Cans of beer per week    Comment: 3 beers/week   Drug use: No   Sexual activity: Yes  Other Topics Concern   Not on file  Social History Narrative   Not on file   Social Drivers of Health   Financial Resource Strain: Not on file  Food Insecurity: Not on file  Transportation Needs: Not on file  Physical Activity: Not on file  Stress: Not on file  Social Connections: Not on file  Intimate Partner Violence: Not on file     Constitutional: Denies fever, malaise, fatigue, headache or abrupt weight changes.  HEENT: Denies eye pain, eye redness, ear pain, ringing in the ears, wax buildup, runny nose, nasal congestion, bloody nose,  or sore throat. Respiratory: Denies difficulty breathing, shortness of breath, cough or sputum production.   Cardiovascular: Denies chest pain, chest tightness, palpitations or swelling in the hands or feet.  Gastrointestinal: Denies abdominal pain, bloating, constipation, diarrhea or blood in the stool.  GU: Denies urgency, frequency, pain with urination, burning sensation, blood in urine, odor or discharge. Musculoskeletal: Denies decrease in range of motion, difficulty with gait, muscle pain or joint pain and swelling.  Skin: Denies redness, rashes, lesions or ulcercations.  Neurological: Denies dizziness, difficulty with memory, difficulty with speech or problems with balance and coordination.  Psych: Denies anxiety, depression, SI/HI.  No other specific complaints in a complete review of systems (except as listed in HPI above).   Objective:    Vision:  Uncorrected Corrected Horizontal Field of Vision  Right Eye 20/25  70 degrees  Left Eye  20/25  70 degrees  Both Eyes  20/25     Applicant can recognize and distinguish among traffic control signals and devices showing standard red, green, and amber colors.   Monocular Vision?: No   Hearing:        Right Ear  > 17ft     Left Ear  > 40ft      BP 128/74 (BP Location: Left Arm, Patient Position: Sitting, Cuff Size: Normal)   Pulse 83   Ht 6' 1 (1.854 m)  Wt 209 lb 9.6 oz (95.1 kg)   SpO2 95%   BMI 27.65 kg/m    Wt Readings from Last 3 Encounters:  11/16/21 203 lb (92.1 kg)  03/29/21 202 lb (91.6 kg)  03/22/21 201 lb (91.2 kg)    General: Appears his stated age, overweight, in NAD. Skin: Warm, dry and intact. No rashes, lesions or ulcerations noted. HEENT: Head: normal shape and size; Eyes: sclera white, no icterus, conjunctiva pink, PERRLA and EOMs intact; Ears: Tm's gray and intact, normal light reflex; Nose: mucosa pink and moist, septum midline; Throat/Mouth: Teeth present, mucosa pink and moist, no exudate,  lesions or ulcerations noted.  Neck:  Neck supple, trachea midline. No masses, lumps or thyromegaly present.  Cardiovascular: Normal rate and rhythm. S1,S2 noted.  No murmur, rubs or gallops noted. No JVD or BLE edema. No carotid bruits noted. Pulmonary/Chest: Normal effort and positive vesicular breath sounds. No respiratory distress. No wheezes, rales or ronchi noted.  Abdomen: Soft and nontender. Normal bowel sounds. No distention or masses noted. Liver, spleen and kidneys non palpable. Musculoskeletal: Normal range of motion. Strength 5/5 BUE/BLE.  No difficulty with gait.  Neurological: Alert and oriented. Cranial nerves II-XII grossly intact. Coordination normal.  Psychiatric: Mood and affect normal. Behavior is normal. Judgment and thought content normal.   BMET    Component Value Date/Time   NA 140 03/29/2021 1345   NA 138 08/14/2019 1053   K 3.9 03/29/2021 1345   CL 103 03/29/2021 1345   CO2 28 03/29/2021 1345   GLUCOSE 91 03/29/2021 1345   BUN 11 03/29/2021 1345   BUN 9 08/14/2019 1053   CREATININE 1.03 03/29/2021 1345   CALCIUM 10.5 (H) 03/29/2021 1345   GFRNONAA 81 08/14/2019 1053   GFRAA 94 08/14/2019 1053    Lipid Panel     Component Value Date/Time   CHOL 206 (H) 03/29/2021 1345   TRIG 209 (H) 03/29/2021 1345   HDL 58 03/29/2021 1345   CHOLHDL 3.6 03/29/2021 1345   VLDL 44.2 (H) 09/23/2019 1209   LDLCALC 115 (H) 03/29/2021 1345    CBC    Component Value Date/Time   WBC 7.1 03/29/2021 1345   RBC 4.96 03/29/2021 1345   HGB 15.0 03/29/2021 1345   HGB 15.3 08/14/2019 1053   HCT 44.1 03/29/2021 1345   HCT 45.1 08/14/2019 1053   PLT 251 03/29/2021 1345   PLT 156 08/14/2019 1053   MCV 88.9 03/29/2021 1345   MCV 88 08/14/2019 1053   MCH 30.2 03/29/2021 1345   MCHC 34.0 03/29/2021 1345   RDW 13.3 03/29/2021 1345   RDW 13.6 08/14/2019 1053   LYMPHSABS 2.0 08/14/2019 1053   MONOABS 0.7 08/05/2013 1651   EOSABS 0.0 08/14/2019 1053   BASOSABS 0.0 08/14/2019  1053    Hgb A1C Lab Results  Component Value Date   HGBA1C 5.6 03/29/2021        Labs:  Specific Gravity: 1.015 Protein: Negative Glucose: Negative Blood: Negative  Assessment:    Healthy male exam.  Meets standards in 20 CFR 391.41;  qualifies for 2 year certificate.    Plan:    Medical examiners certificate completed and printed. Return as needed.     Angeline Laura, NP
